# Patient Record
Sex: Female | Born: 1985 | Race: White | Hispanic: No | Marital: Married | Smoking: Current every day smoker
Health system: Southern US, Community
[De-identification: ages and names within clinical notes are randomized; demographics above are authoritative.]

## PROBLEM LIST (undated history)

## (undated) DIAGNOSIS — F419 Anxiety disorder, unspecified: Secondary | ICD-10-CM

## (undated) DIAGNOSIS — Z789 Other specified health status: Secondary | ICD-10-CM

## (undated) DIAGNOSIS — F32A Depression, unspecified: Secondary | ICD-10-CM

## (undated) DIAGNOSIS — F329 Major depressive disorder, single episode, unspecified: Secondary | ICD-10-CM

## (undated) HISTORY — PX: DILATION AND CURETTAGE OF UTERUS: SHX78

## (undated) HISTORY — PX: TONSILLECTOMY: SUR1361

---
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
## 2007-03-04 ENCOUNTER — Telehealth (INDEPENDENT_AMBULATORY_CARE_PROVIDER_SITE_OTHER): Payer: Self-pay | Admitting: *Deleted

## 2007-03-22 ENCOUNTER — Encounter: Payer: Self-pay | Admitting: Internal Medicine

## 2007-03-22 ENCOUNTER — Emergency Department: Payer: Self-pay | Admitting: Emergency Medicine

## 2007-03-22 ENCOUNTER — Other Ambulatory Visit: Payer: Self-pay

## 2007-03-22 ENCOUNTER — Telehealth: Payer: Self-pay | Admitting: Internal Medicine

## 2007-04-18 ENCOUNTER — Telehealth: Payer: Self-pay | Admitting: Family Medicine

## 2007-05-06 ENCOUNTER — Ambulatory Visit: Payer: Self-pay | Admitting: Internal Medicine

## 2007-05-06 DIAGNOSIS — H9190 Unspecified hearing loss, unspecified ear: Secondary | ICD-10-CM | POA: Insufficient documentation

## 2007-05-06 DIAGNOSIS — R079 Chest pain, unspecified: Secondary | ICD-10-CM

## 2007-05-17 ENCOUNTER — Encounter (INDEPENDENT_AMBULATORY_CARE_PROVIDER_SITE_OTHER): Payer: Self-pay | Admitting: *Deleted

## 2007-05-20 ENCOUNTER — Encounter (INDEPENDENT_AMBULATORY_CARE_PROVIDER_SITE_OTHER): Payer: Self-pay | Admitting: *Deleted

## 2007-05-26 ENCOUNTER — Encounter (INDEPENDENT_AMBULATORY_CARE_PROVIDER_SITE_OTHER): Payer: Self-pay | Admitting: *Deleted

## 2007-06-15 ENCOUNTER — Ambulatory Visit: Payer: Self-pay | Admitting: Family Medicine

## 2007-06-15 DIAGNOSIS — R1031 Right lower quadrant pain: Secondary | ICD-10-CM

## 2007-06-15 LAB — CONVERTED CEMR LAB
Beta hcg, urine, semiquantitative: NEGATIVE
Glucose, Urine, Semiquant: NEGATIVE
Protein, U semiquant: NEGATIVE
Urobilinogen, UA: 0.2
WBC Urine, dipstick: NEGATIVE
pH: 7

## 2007-06-17 ENCOUNTER — Ambulatory Visit (HOSPITAL_COMMUNITY): Admission: RE | Admit: 2007-06-17 | Discharge: 2007-06-17 | Payer: Self-pay | Admitting: Family Medicine

## 2007-06-17 LAB — CONVERTED CEMR LAB: GC Probe Amp, Genital: NEGATIVE

## 2007-08-03 ENCOUNTER — Telehealth (INDEPENDENT_AMBULATORY_CARE_PROVIDER_SITE_OTHER): Payer: Self-pay | Admitting: *Deleted

## 2007-09-08 ENCOUNTER — Ambulatory Visit: Payer: Self-pay | Admitting: Family Medicine

## 2007-09-08 LAB — CONVERTED CEMR LAB
Bilirubin Urine: NEGATIVE
Ketones, urine, test strip: NEGATIVE
Specific Gravity, Urine: <1.005
Urobilinogen, UA: 0.2

## 2007-09-09 ENCOUNTER — Telehealth: Payer: Self-pay | Admitting: Family Medicine

## 2007-09-09 ENCOUNTER — Encounter: Payer: Self-pay | Admitting: Family Medicine

## 2007-09-09 ENCOUNTER — Ambulatory Visit: Payer: Self-pay | Admitting: Cardiovascular Disease

## 2007-09-09 LAB — CONVERTED CEMR LAB
Basophils Absolute: 0 10*3/uL (ref 0.0–0.1)
Lymphocytes Relative: 33.6 % (ref 12.0–46.0)
MCHC: 34.3 g/dL (ref 30.0–36.0)
Neutrophils Relative %: 56.6 % (ref 43.0–77.0)
RDW: 12.5 % (ref 11.5–14.6)

## 2007-09-13 ENCOUNTER — Ambulatory Visit: Payer: Self-pay | Admitting: Obstetrics & Gynecology

## 2007-10-17 ENCOUNTER — Ambulatory Visit: Payer: Self-pay | Admitting: Family Medicine

## 2007-11-15 ENCOUNTER — Telehealth: Payer: Self-pay | Admitting: Internal Medicine

## 2007-11-28 ENCOUNTER — Ambulatory Visit: Payer: Self-pay | Admitting: Internal Medicine

## 2007-11-28 DIAGNOSIS — N6019 Diffuse cystic mastopathy of unspecified breast: Secondary | ICD-10-CM

## 2007-12-26 ENCOUNTER — Ambulatory Visit: Payer: Self-pay | Admitting: Internal Medicine

## 2008-01-17 ENCOUNTER — Ambulatory Visit: Payer: Self-pay | Admitting: Unknown Physician Specialty

## 2008-02-08 ENCOUNTER — Telehealth: Payer: Self-pay | Admitting: Family Medicine

## 2008-03-13 ENCOUNTER — Telehealth: Payer: Self-pay | Admitting: Internal Medicine

## 2008-04-20 ENCOUNTER — Ambulatory Visit: Payer: Self-pay | Admitting: Internal Medicine

## 2008-04-20 DIAGNOSIS — J069 Acute upper respiratory infection, unspecified: Secondary | ICD-10-CM | POA: Insufficient documentation

## 2008-06-25 ENCOUNTER — Telehealth: Payer: Self-pay | Admitting: Internal Medicine

## 2008-09-06 ENCOUNTER — Emergency Department: Payer: Self-pay | Admitting: Emergency Medicine

## 2009-01-03 ENCOUNTER — Emergency Department: Payer: Self-pay | Admitting: Emergency Medicine

## 2009-03-04 ENCOUNTER — Observation Stay: Payer: Self-pay

## 2009-08-14 ENCOUNTER — Observation Stay: Payer: Self-pay

## 2009-08-15 ENCOUNTER — Observation Stay: Payer: Self-pay

## 2009-08-17 ENCOUNTER — Inpatient Hospital Stay: Payer: Self-pay | Admitting: Obstetrics & Gynecology

## 2010-02-02 LAB — CONVERTED CEMR LAB
Chlamydia, DNA Probe: NEGATIVE
GC Probe Amp, Genital: NEGATIVE

## 2011-04-22 ENCOUNTER — Inpatient Hospital Stay (HOSPITAL_COMMUNITY)
Admission: EM | Admit: 2011-04-22 | Discharge: 2011-04-22 | Disposition: A | Discharge status: Home / Self Care | Payer: Medicaid Other | Attending: Obstetrics and Gynecology | Admitting: Obstetrics and Gynecology

## 2011-04-22 ENCOUNTER — Encounter (HOSPITAL_COMMUNITY): Payer: Self-pay | Admitting: *Deleted

## 2011-04-22 ENCOUNTER — Inpatient Hospital Stay (HOSPITAL_COMMUNITY): Payer: Medicaid Other

## 2011-04-22 DIAGNOSIS — Z3480 Encounter for supervision of other normal pregnancy, unspecified trimester: Secondary | ICD-10-CM

## 2011-04-22 DIAGNOSIS — O99891 Other specified diseases and conditions complicating pregnancy: Secondary | ICD-10-CM | POA: Insufficient documentation

## 2011-04-22 DIAGNOSIS — Z331 Pregnant state, incidental: Secondary | ICD-10-CM

## 2011-04-22 DIAGNOSIS — Y92410 Unspecified street and highway as the place of occurrence of the external cause: Secondary | ICD-10-CM | POA: Insufficient documentation

## 2011-04-22 DIAGNOSIS — R109 Unspecified abdominal pain: Secondary | ICD-10-CM | POA: Insufficient documentation

## 2011-04-22 HISTORY — DX: Other specified health status: Z78.9

## 2011-04-22 LAB — DIFFERENTIAL
Eosinophils Absolute: 0.2 10*3/uL (ref 0.0–0.7)
Eosinophils Relative: 2 % (ref 0–5)
Lymphocytes Relative: 19 % (ref 12–46)
Lymphs Abs: 2.1 10*3/uL (ref 0.7–4.0)
Monocytes Relative: 6 % (ref 3–12)

## 2011-04-22 LAB — CBC
HCT: 36.8 % (ref 36.0–46.0)
Hemoglobin: 12.5 g/dL (ref 12.0–15.0)
MCH: 31.3 pg (ref 26.0–34.0)
MCV: 92 fL (ref 78.0–100.0)
RBC: 4 MIL/uL (ref 3.87–5.11)
WBC: 11 10*3/uL — ABNORMAL HIGH (ref 4.0–10.5)

## 2011-04-22 MED ORDER — LACTATED RINGERS IV SOLN
INTRAVENOUS | Status: DC
  Administered 2011-04-22: 12:00:00 via INTRAVENOUS

## 2011-04-22 MED ORDER — LACTATED RINGERS IV SOLN: INTRAVENOUS | Status: DC
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
## 2011-04-22 NOTE — Discharge Instructions (Signed)
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
## 2011-04-22 NOTE — Progress Notes (Signed)
Updated ED MD and RN about [facility]. [facility] called and report given to [place].
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
## 2011-04-22 NOTE — Progress Notes (Signed)
Responded to page to provide Chaplain support. Social Worker involved and Orthoptist with pt.  Pt. Being transferred to [facility].

## 2011-04-22 NOTE — Progress Notes (Signed)
Dr. Estanislado Pandy called and updated on pt status with FHR reactive at 130, moderate variability, + accels, no decels, positive fetal movement palpated. Uterine irritability verses contractions every 3-5 min lasting 30-40 secs. Orders for monitoring, and transfer to MAU at [facility].

## 2011-05-20 ENCOUNTER — Observation Stay: Payer: Self-pay | Admitting: Internal Medicine

## 2011-05-25 ENCOUNTER — Inpatient Hospital Stay: Payer: Self-pay | Admitting: Obstetrics and Gynecology

## 2011-05-25 LAB — CBC WITH DIFFERENTIAL/PLATELET
Basophil #: 0.1 10*3/uL (ref 0.0–0.1)
Eosinophil %: 1 %
HGB: 11.7 g/dL — ABNORMAL LOW (ref 12.0–16.0)
Lymphocyte #: 2.4 10*3/uL (ref 1.0–3.6)
MCV: 94 fL (ref 80–100)
Monocyte #: 0.9 x10 3/mm (ref 0.2–0.9)
Monocyte %: 7 %
WBC: 13.1 10*3/uL — ABNORMAL HIGH (ref 3.6–11.0)

## 2011-07-14 ENCOUNTER — Telehealth: Payer: Self-pay | Admitting: *Deleted

## 2011-07-14 NOTE — Telephone Encounter (Signed)
Pt calling wanting to be seen for her depression/ anxiety meds, pt last seen 04/2008, pt also has [employer], I advised pt she would have to see provider on her card, per pt she will call and schedule appt with them.

## 2013-11-06 ENCOUNTER — Encounter (HOSPITAL_COMMUNITY): Payer: Self-pay | Admitting: *Deleted

## 2014-05-27 ENCOUNTER — Emergency Department: Payer: Medicaid Other

## 2014-05-27 ENCOUNTER — Emergency Department
Admission: EM | Admit: 2014-05-27 | Discharge: 2014-05-27 | Disposition: A | Discharge status: Home / Self Care | Payer: Medicaid Other | Attending: Emergency Medicine | Admitting: Emergency Medicine

## 2014-05-27 DIAGNOSIS — Z3A01 Less than 8 weeks gestation of pregnancy: Secondary | ICD-10-CM | POA: Insufficient documentation

## 2014-05-27 DIAGNOSIS — O99341 Other mental disorders complicating pregnancy, first trimester: Secondary | ICD-10-CM | POA: Insufficient documentation

## 2014-05-27 DIAGNOSIS — Z79899 Other long term (current) drug therapy: Secondary | ICD-10-CM | POA: Insufficient documentation

## 2014-05-27 DIAGNOSIS — R109 Unspecified abdominal pain: Secondary | ICD-10-CM

## 2014-05-27 DIAGNOSIS — O200 Threatened abortion: Secondary | ICD-10-CM | POA: Insufficient documentation

## 2014-05-27 DIAGNOSIS — F419 Anxiety disorder, unspecified: Secondary | ICD-10-CM | POA: Insufficient documentation

## 2014-05-27 LAB — URINALYSIS COMPLETE WITH MICROSCOPIC (ARMC ONLY)
BILIRUBIN URINE: NEGATIVE
Glucose, UA: NEGATIVE mg/dL
KETONES UR: NEGATIVE mg/dL
LEUKOCYTES UA: NEGATIVE
Nitrite: NEGATIVE
Protein, ur: NEGATIVE mg/dL
Specific Gravity, Urine: 1.003 — ABNORMAL LOW (ref 1.005–1.030)
pH: 8 (ref 5.0–8.0)

## 2014-05-27 LAB — CBC
HCT: 44.6 % (ref 35.0–47.0)
HEMOGLOBIN: 15 g/dL (ref 12.0–16.0)
MCH: 31.7 pg (ref 26.0–34.0)
MCHC: 33.5 g/dL (ref 32.0–36.0)
MCV: 94.4 fL (ref 80.0–100.0)
PLATELETS: 223 10*3/uL (ref 150–440)
RBC: 4.72 MIL/uL (ref 3.80–5.20)
RDW: 12.9 % (ref 11.5–14.5)
WBC: 9.3 10*3/uL (ref 3.6–11.0)

## 2014-05-27 LAB — ABO/RH
ABO/RH(D): A POS
ABO/RH(D): A POS

## 2014-05-27 LAB — HCG, QUANTITATIVE, PREGNANCY: hCG, Beta Chain, Quant, S: 402 m[IU]/mL — ABNORMAL HIGH (ref <5)

## 2014-05-27 MED ORDER — PROMETHAZINE HCL 25 MG PO TABS
ORAL_TABLET | ORAL | Status: AC
  Administered 2014-05-27: 12.5 mg via ORAL
  Filled 2014-05-27: qty 1

## 2014-05-27 MED ORDER — PROMETHAZINE HCL 25 MG PO TABS
12.5000 mg | ORAL_TABLET | Freq: Once | ORAL | Status: AC
  Administered 2014-05-27: 12.5 mg via ORAL

## 2014-05-27 NOTE — ED Notes (Signed)
Pt reports she is [redacted] weeks pregnant and began having vaginal bleeding one hour ago. Pt crying in triage.

## 2014-05-27 NOTE — ED Provider Notes (Signed)
[facility] Emergency Department Provider Note  ____________________________________________  Time seen: 7:25 AM  I have reviewed the triage vital signs and the nursing notes.   HISTORY  Chief Complaint Vaginal Bleeding      HPI Sarah Rivas is a 29 y.o. female who presents with vaginal bleeding that began at approximately 3 AM. She reports she is approximately [redacted] weeks pregnant. She denies pelvic cramping. No fevers chills. No nausea vomiting. Severity is mild.     Past Medical History  Diagnosis Date  . No pertinent past medical history     Patient Active Problem List   Diagnosis Date Noted  . URI 04/20/2008  . FIBROCYSTIC BREAST DISEASE 11/28/2007  . ABDOMINAL PAIN, RIGHT LOWER QUADRANT 06/15/2007  . HEARING LOSS 05/06/2007  . CHEST PAIN 05/06/2007  . COMMON MIGRAINE 09/01/2006  . DISORDER, MENSTRUAL NOS 09/01/2006  . ANXIETY 04/19/2006  . ANXIETY, SITUATIONAL 04/19/2006  . DEPRESSION 04/19/2006  . SYNCOPE 04/19/2006    Past Surgical History  Procedure Laterality Date  . Tonsillectomy      Current Outpatient Rx  Name  Route  Sig  Dispense  Refill  . prenatal vitamin w/FE, FA (PRENATAL 1 + 1) 27-1 MG TABS   Oral   Take 1 tablet by mouth daily.           Allergies Codeine and Sertraline hcl  Family History  Problem Relation Age of Onset  . Anesthesia problems Neg Hx     Social History History  Substance Use Topics  . Smoking status: Never Smoker   . Smokeless tobacco: Not on file  . Alcohol Use: No    Review of Systems  Constitutional: Negative for fever. Eyes: Negative for visual changes. ENT: Negative for sore throat. Cardiovascular: Negative for chest pain. Respiratory: Negative for shortness of breath. Gastrointestinal: Negative for abdominal pain, vomiting and diarrhea. Genitourinary: Negative for dysuria. Positive for vaginal bleeding Musculoskeletal: Negative for back pain. Skin: Negative for  rash. Neurological: Negative for headaches, focal weakness or numbness. Psychiatric: Anxious, tearful  10-point ROS otherwise negative.  ____________________________________________   PHYSICAL EXAM:  VITAL SIGNS: ED Triage Vitals  Enc Vitals Group     BP 05/27/14 0433 97/71 mmHg     Pulse Rate 05/27/14 0433 85     Resp 05/27/14 0433 28     Temp 05/27/14 0433 97.5 F (36.4 C)     Temp Source 05/27/14 0433 Oral     SpO2 05/27/14 0433 100 %     Weight 05/27/14 0430 120 lb (54.432 kg)     Height 05/27/14 0430 5\' 3"  (1.6 m)     Head Cir --      Peak Flow --      Pain Score 05/27/14 0430 6     Pain Loc --      Pain Edu? --      Excl. in [place]? --      Constitutional: Alert and oriented. Well appearing and in no distress. Eyes: Conjunctivae are normal. PERRL. Normal extraocular movements.  Cardiovascular: Normal rate, regular rhythm. Normal and symmetric distal pulses are present in all extremities. No murmurs, rubs, or gallops. Respiratory: Normal respiratory effort without tachypnea nor retractions. Breath sounds are clear and equal bilaterally. No wheezes/rales/rhonchi. Gastrointestinal: Soft and nontender. No distention. There is no CVA tenderness. Genitourinary: deferred Musculoskeletal: Nontender with normal range of motion in all extremities. No joint effusions.  No lower extremity tenderness nor edema. Neurologic:  Normal speech and language. No gross  focal neurologic deficits are appreciated. Speech is normal.  Skin:  Skin is warm, dry and intact. No rash noted. Psychiatric: Mood and affect are normal. Speech and behavior are normal. Patient exhibits appropriate insight and judgment.  ____________________________________________    LABS (pertinent positives/negatives)  Labs Reviewed  HCG, QUANTITATIVE, PREGNANCY - Abnormal; Notable for the following:    hCG, Beta Chain, Quant, S 402 (*)    All other components within normal limits  URINALYSIS COMPLETEWITH  MICROSCOPIC ([facility])  - Abnormal; Notable for the following:    Color, Urine STRAW (*)    APPearance CLEAR (*)    Specific Gravity, Urine 1.003 (*)    Hgb urine dipstick 3+ (*)    Bacteria, UA RARE (*)    Squamous Epithelial / LPF 0-5 (*)    All other components within normal limits  CBC  ABO/RH  ABO/RH     ____________________________________________   EKG  None  ____________________________________________    RADIOLOGY  Ultrasound shows no intrauterine pregnancy  ____________________________________________   PROCEDURES  Procedure(s) performed: None  Critical Care performed: None ____________________________________________   INITIAL IMPRESSION / ASSESSMENT AND PLAN / ED COURSE  Pertinent labs & imaging results that were available during my care of the patient were reviewed by me and considered in my medical decision making (see chart for details).  Patient with likely very early pregnancy versus miscarriage versus possible ectopic. Given beta and no IUP on ultrasound patient will need repeat labs in 2 days. She understands this and will follow up with her GYN. Hemoglobin 15  ____________________________________________   FINAL CLINICAL IMPRESSION(S) / ED DIAGNOSES  Threatened miscarriage   Lavonia Drafts, MD 05/27/14 [id number]

## 2014-05-27 NOTE — Discharge Instructions (Signed)
Your hormone level is 403 today. This needs to be rechecked in 48 hours.     Threatened Miscarriage A threatened miscarriage is when you have vaginal bleeding during your first 20 weeks of pregnancy but the pregnancy has not ended. Your doctor will do tests to make sure you are still pregnant. The cause of the bleeding may not be known. This condition does not mean your pregnancy will end. It does increase the risk of it ending (complete miscarriage). HOME CARE   Make sure you keep all your doctor visits for prenatal care.  Get plenty of rest.  Do not have sex or use tampons if you have vaginal bleeding.  Do not douche.  Do not smoke or use drugs.  Do not drink alcohol.  Avoid caffeine. GET HELP IF:  You have light bleeding from your vagina.  You have belly pain or cramping.  You have a fever. GET HELP RIGHT AWAY IF:   You have heavy bleeding from your vagina.  You have clots of blood coming from your vagina.  You have bad pain or cramps in your low back or belly.  You have fever, chills, and bad belly pain. MAKE SURE YOU:   Understand these instructions.  Will watch your condition.  Will get help right away if you are not doing well or get worse. Document Released: 12/05/2007 Document Revised: 12/27/2012 Document Reviewed: 10/18/2012 [facility] Patient Information 2015 [place], [place]. This information is not intended to replace advice given to you by your health care provider. Make sure you discuss any questions you have with your health care provider.

## 2014-05-29 ENCOUNTER — Emergency Department
Admission: EM | Admit: 2014-05-29 | Discharge: 2014-05-29 | Disposition: A | Discharge status: Home / Self Care | Payer: Medicaid Other | Attending: Emergency Medicine | Admitting: Emergency Medicine

## 2014-05-29 ENCOUNTER — Encounter: Payer: Self-pay | Admitting: Emergency Medicine

## 2014-05-29 DIAGNOSIS — Z3A01 Less than 8 weeks gestation of pregnancy: Secondary | ICD-10-CM | POA: Insufficient documentation

## 2014-05-29 DIAGNOSIS — O039 Complete or unspecified spontaneous abortion without complication: Secondary | ICD-10-CM | POA: Insufficient documentation

## 2014-05-29 DIAGNOSIS — Z79899 Other long term (current) drug therapy: Secondary | ICD-10-CM | POA: Insufficient documentation

## 2014-05-29 LAB — CBC
HCT: 42.6 % (ref 35.0–47.0)
Hemoglobin: 14.2 g/dL (ref 12.0–16.0)
MCH: 31.9 pg (ref 26.0–34.0)
MCHC: 33.4 g/dL (ref 32.0–36.0)
MCV: 95.6 fL (ref 80.0–100.0)
Platelets: 210 10*3/uL (ref 150–440)
RBC: 4.46 MIL/uL (ref 3.80–5.20)
RDW: 12.9 % (ref 11.5–14.5)
WBC: 5.6 10*3/uL (ref 3.6–11.0)

## 2014-05-29 LAB — HCG, QUANTITATIVE, PREGNANCY: hCG, Beta Chain, Quant, S: 92 m[IU]/mL — ABNORMAL HIGH (ref <5)

## 2014-05-29 NOTE — ED Notes (Signed)
States she started bleeding on Sunday am ..pt is 4 weeks preg.  Bleeding is slower now but was heavy yesterday

## 2014-05-29 NOTE — Discharge Instructions (Signed)
Miscarriage A miscarriage is the sudden loss of an unborn baby (fetus) before the 20th week of pregnancy. Most miscarriages happen in the first 3 months of pregnancy. Sometimes, it happens before a woman even knows she is pregnant. A miscarriage is also called a "spontaneous miscarriage" or "early pregnancy loss." Having a miscarriage can be an emotional experience. Talk with your caregiver about any questions you may have about miscarrying, the grieving process, and your future pregnancy plans. CAUSES   Problems with the fetal chromosomes that make it impossible for the baby to develop normally. Problems with the baby's genes or chromosomes are most often the result of errors that occur, by chance, as the embryo divides and grows. The problems are not inherited from the parents.  Infection of the cervix or uterus.   Hormone problems.   Problems with the cervix, such as having an incompetent cervix. This is when the tissue in the cervix is not strong enough to hold the pregnancy.   Problems with the uterus, such as an abnormally shaped uterus, uterine fibroids, or congenital abnormalities.   Certain medical conditions.   Smoking, drinking alcohol, or taking illegal drugs.   Trauma.  Often, the cause of a miscarriage is unknown.  SYMPTOMS   Vaginal bleeding or spotting, with or without cramps or pain.  Pain or cramping in the abdomen or lower back.  Passing fluid, tissue, or blood clots from the vagina. DIAGNOSIS  Your caregiver will perform a physical exam. You may also have an ultrasound to confirm the miscarriage. Blood or urine tests may also be ordered. TREATMENT   Sometimes, treatment is not necessary if you naturally pass all the fetal tissue that was in the uterus. If some of the fetus or placenta remains in the body (incomplete miscarriage), tissue left behind may become infected and must be removed. Usually, a dilation and curettage (D and C) procedure is performed.  During a D and C procedure, the cervix is widened (dilated) and any remaining fetal or placental tissue is gently removed from the uterus.  Antibiotic medicines are prescribed if there is an infection. Other medicines may be given to reduce the size of the uterus (contract) if there is a lot of bleeding.  If you have Rh negative blood and your baby was Rh positive, you will need a Rh immunoglobulin shot. This shot will protect any future baby from having Rh blood problems in future pregnancies. HOME CARE INSTRUCTIONS   Your caregiver may order bed rest or may allow you to continue light activity. Resume activity as directed by your caregiver.  Have someone help with home and family responsibilities during this time.   Keep track of the number of sanitary pads you use each day and how soaked (saturated) they are. Write down this information.   Do not use tampons. Do not douche or have sexual intercourse until approved by your caregiver.   Only take over-the-counter or prescription medicines for pain or discomfort as directed by your caregiver.   Do not take aspirin. Aspirin can cause bleeding.   Keep all follow-up appointments with your caregiver.   If you or your partner have problems with grieving, talk to your caregiver or seek counseling to help cope with the pregnancy loss. Allow enough time to grieve before trying to get pregnant again.  SEEK IMMEDIATE MEDICAL CARE IF:   You have severe cramps or pain in your back or abdomen.  You have a fever.  You pass large blood clots (walnut-sized   or larger) ortissue from your vagina. Save any tissue for your caregiver to inspect.   Your bleeding increases.   You have a thick, bad-smelling vaginal discharge.  You become lightheaded, weak, or you faint.   You have chills.  MAKE SURE YOU:  Understand these instructions.  Will watch your condition.  Will get help right away if you are not doing well or get  worse. Document Released: 06/17/2000 Document Revised: 04/18/2012 Document Reviewed: 02/10/2011 [employer] Patient Information 2015 [employer]. This information is not intended to replace advice given to you by your health care provider. Make sure you discuss any questions you have with your health care provider.  

## 2014-05-29 NOTE — ED Notes (Signed)
Pt states she was seen in the ED on Sunday for possible miscarriage, states she was supposed to have her beta hcg rechecked, states she called ob MD and was advised that she needed $600 to come and have blood drawn so she returned here to have repeat test, pt states she now only has some mild lower abd. Cramping and the bleeding has improved

## 2014-05-29 NOTE — ED Provider Notes (Signed)
[facility] Emergency Department Provider Note  ____________________________________________  Time seen: 1:35 PM  I have reviewed the triage vital signs and the nursing notes.   HISTORY  Chief Complaint Vaginal Bleeding    HPI Sarah Rivas is a 29 y.o. female who reports she is about [redacted] weeks pregnant and comes in with vaginal bleeding for 3 days. The bleeding is similar to her menstrual cycle does not have a she's not soaking through multiple pads per hour. No chest pain shortness of breath fever chills nausea vomiting lightheadedness dizziness or passing out. In the ED 2 days ago diagnosed with a threatened miscarriage and told to follow-up for repeat blood test with OB. However she reports she is unable to afford DOB test so she re-presents to the ED for a repeat blood test. No new symptoms. Denies abdominal pain.     Past Medical History  Diagnosis Date  . No pertinent past medical history    migraines Anxiety  Patient Active Problem List   Diagnosis Date Noted  . URI 04/20/2008  . FIBROCYSTIC BREAST DISEASE 11/28/2007  . ABDOMINAL PAIN, RIGHT LOWER QUADRANT 06/15/2007  . HEARING LOSS 05/06/2007  . CHEST PAIN 05/06/2007  . COMMON MIGRAINE 09/01/2006  . DISORDER, MENSTRUAL NOS 09/01/2006  . ANXIETY 04/19/2006  . ANXIETY, SITUATIONAL 04/19/2006  . DEPRESSION 04/19/2006  . SYNCOPE 04/19/2006    Past Surgical History  Procedure Laterality Date  . Tonsillectomy      Current Outpatient Rx  Name  Route  Sig  Dispense  Refill  . prenatal vitamin w/FE, FA (PRENATAL 1 + 1) 27-1 MG TABS   Oral   Take 1 tablet by mouth daily.           Allergies Codeine and Sertraline hcl  Family History  Problem Relation Age of Onset  . Anesthesia problems Neg Hx     Social History History  Substance Use Topics  . Smoking status: Never Smoker   . Smokeless tobacco: Not on file  . Alcohol Use: No    Review of Systems  Constitutional: No  fever or chills. No weight changes Eyes:No blurry vision or double vision.  ENT: No sore throat. Cardiovascular: No chest pain. Respiratory: No dyspnea or cough. Gastrointestinal: Negative for abdominal pain, vomiting and diarrhea.  No BRBPR or melena. Genitourinary: Negative for dysuria, urinary retention, bloody urine, or difficulty urinating. Positive vaginal bleeding, pregnant Musculoskeletal: Negative for back pain. No joint swelling or pain. Skin: Negative for rash. Neurological: Negative for headaches, focal weakness or numbness. Psychiatric:No anxiety or depression.   Endocrine:No hot/cold intolerance, changes in energy, or sleep difficulty.  10-point ROS otherwise negative.  ____________________________________________   PHYSICAL EXAM:  VITAL SIGNS: ED Triage Vitals  Enc Vitals Group     BP 05/29/14 1105 116/54 mmHg     Pulse Rate 05/29/14 1105 94     Resp 05/29/14 1105 18     Temp 05/29/14 1105 98.5 F (36.9 C)     Temp Source 05/29/14 1105 Oral     SpO2 --      Weight 05/29/14 1105 117 lb (53.071 kg)     Height 05/29/14 1105 5\' 3"  (1.6 m)     Head Cir --      Peak Flow --      Pain Score 05/29/14 1119 4     Pain Loc --      Pain Edu? --      Excl. in [place]? --  Constitutional: Alert and oriented. Well appearing and in no distress. Patient appears upset and is tearful Eyes: No scleral icterus. No conjunctival pallor. PERRL. EOMI ENT   Head: Normocephalic and atraumatic.   Nose: No congestion/rhinnorhea. No septal hematoma   Mouth/Throat: MMM, no pharyngeal erythema. No peritonsillar mass. No uvula shift.   Neck: No stridor. No SubQ emphysema. No meningismus. Hematological/Lymphatic/Immunilogical: No cervical lymphadenopathy. Cardiovascular: RRR. Normal and symmetric distal pulses are present in all extremities. No murmurs, rubs, or gallops. Respiratory: Normal respiratory effort without tachypnea nor retractions. Breath sounds are clear and  equal bilaterally. No wheezes/rales/rhonchi. Gastrointestinal: Soft and nontender. No distention. There is no CVA tenderness.  No rebound, rigidity, or guarding. Genitourinary: deferred Musculoskeletal: Nontender with normal range of motion in all extremities. No joint effusions.  No lower extremity tenderness.  No edema. Neurologic:   Normal speech and language.  CN 2-10 normal. Motor grossly intact. No pronator drift.  Normal gait. No gross focal neurologic deficits are appreciated.  Skin:  Skin is warm, dry and intact. No rash noted.  No petechiae, purpura, or bullae. Psychiatric: Mood and affect are normal. Speech and behavior are normal. Patient exhibits appropriate insight and judgment.  ____________________________________________    LABS (pertinent positives/negatives) (all labs ordered are listed, but only abnormal results are displayed) Labs Reviewed  HCG, QUANTITATIVE, PREGNANCY - Abnormal; Notable for the following:    hCG, Beta Chain, Quant, S 92 (*)    All other components within normal limits  CBC  ABO/RH   Rh+ ____________________________________________   EKG    ____________________________________________    RADIOLOGY  Pelvic ultrasound on May 22 reveals empty uterus with tendon endometrial fluid collection. Unable to identify a definitive pregnancy location.  ____________________________________________   PROCEDURES  ____________________________________________   INITIAL IMPRESSION / ASSESSMENT AND PLAN / ED COURSE  Pertinent labs & imaging results that were available during my care of the patient were reviewed by me and considered in my medical decision making (see chart for details).  Patient no acute distress with reassuring examination today. Low suspicion of ectopic pregnancy or endometritis or other infection. Beta hCG has decreased from 490 over 2 days consistent with terminated pregnancy and spontaneous abortion. At this time it appears  that the miscarriage is complete. She is calm and comfortable and I counseled her on what to expect and when to follow-up.  ____________________________________________   FINAL CLINICAL IMPRESSION(S) / ED DIAGNOSES  Final diagnoses:  Spontaneous abortion in first trimester      Carrie Mew, MD 05/29/14 1349

## 2014-12-12 ENCOUNTER — Emergency Department
Admission: EM | Admit: 2014-12-12 | Discharge: 2014-12-12 | Disposition: A | Discharge status: Home / Self Care | Payer: Medicaid Other | Attending: Emergency Medicine | Admitting: Emergency Medicine

## 2014-12-12 DIAGNOSIS — J029 Acute pharyngitis, unspecified: Secondary | ICD-10-CM | POA: Diagnosis not present

## 2014-12-12 DIAGNOSIS — D210 Benign neoplasm of connective and other soft tissue of head, face and neck: Secondary | ICD-10-CM | POA: Insufficient documentation

## 2014-12-12 DIAGNOSIS — D2321 Other benign neoplasm of skin of right ear and external auricular canal: Secondary | ICD-10-CM

## 2014-12-12 LAB — POCT RAPID STREP A: STREPTOCOCCUS, GROUP A SCREEN (DIRECT): NEGATIVE

## 2014-12-12 MED ORDER — IBUPROFEN 800 MG PO TABS: 800.0000 mg | ORAL_TABLET | Freq: Three times a day (TID) | ORAL | Status: DC | PRN

## 2014-12-12 MED ORDER — AZITHROMYCIN 250 MG PO TABS: ORAL_TABLET | ORAL | Status: DC

## 2014-12-12 MED ORDER — LIDOCAINE VISCOUS 2 % MT SOLN: 20.0000 mL | OROMUCOSAL | Status: DC | PRN

## 2014-12-12 NOTE — ED Provider Notes (Signed)
[facility] Emergency Department Provider Note  ____________________________________________  Time seen: Approximately 8:47 PM  I have reviewed the triage vital signs and the nursing notes.   HISTORY  Chief Complaint Sore Throat    HPI Sarah Rivas is a 29 y.o. female who presents for evaluation of sore throat with lumps on the right side of her jaw and neck. Patient states she states that there is a bump on the top of her right ear this been getting worse. Complains of having headache. Denies any fever chills or cough.   Past Medical History  Diagnosis Date  . No pertinent past medical history     Patient Active Problem List   Diagnosis Date Noted  . URI 04/20/2008  . FIBROCYSTIC BREAST DISEASE 11/28/2007  . ABDOMINAL PAIN, RIGHT LOWER QUADRANT 06/15/2007  . HEARING LOSS 05/06/2007  . CHEST PAIN 05/06/2007  . COMMON MIGRAINE 09/01/2006  . DISORDER, MENSTRUAL NOS 09/01/2006  . ANXIETY 04/19/2006  . ANXIETY, SITUATIONAL 04/19/2006  . DEPRESSION 04/19/2006  . SYNCOPE 04/19/2006    Past Surgical History  Procedure Laterality Date  . Tonsillectomy      Current Outpatient Rx  Name  Route  Sig  Dispense  Refill  . azithromycin (ZITHROMAX Z-PAK) 250 MG tablet      Take 2 tablets (500 mg) on  Day 1,  followed by 1 tablet (250 mg) once daily on Days 2 through 5.   6 each   0   . ibuprofen (ADVIL,MOTRIN) 800 MG tablet   Oral   Take 1 tablet (800 mg total) by mouth every 8 (eight) hours as needed.   30 tablet   0   . lidocaine (XYLOCAINE) 2 % solution   Mouth/Throat   Use as directed 20 mLs in the mouth or throat as needed for mouth pain.   100 mL   0     Allergies Codeine and Sertraline hcl  Family History  Problem Relation Age of Onset  . Anesthesia problems Neg Hx     Social History Social History  Substance Use Topics  . Smoking status: Never Smoker   . Smokeless tobacco: None  . Alcohol Use: No    Review of  Systems Constitutional: No fever/chills Eyes: No visual changes. ENT: Positive for sore throat Cardiovascular: Denies chest pain. Respiratory: Denies shortness of breath. Gastrointestinal: No abdominal pain.  No nausea, no vomiting.  No diarrhea.  No constipation. Genitourinary: Negative for dysuria. Musculoskeletal: Negative for back pain. Skin: Negative for rash. Neurological: Negative for headaches, focal weakness or numbness.  10-point ROS otherwise negative.  ____________________________________________   PHYSICAL EXAM:  VITAL SIGNS: ED Triage Vitals  Enc Vitals Group     BP 12/12/14 2016 100/63 mmHg     Pulse Rate 12/12/14 2016 85     Resp 12/12/14 2016 14     Temp 12/12/14 2016 98.7 F (37.1 C)     Temp Source 12/12/14 2016 Oral     SpO2 12/12/14 2016 100 %     Weight 12/12/14 2016 110 lb (49.896 kg)     Height 12/12/14 2016 5\' 3"  (1.6 m)     Head Cir --      Peak Flow --      Pain Score 12/12/14 2016 7     Pain Loc --      Pain Edu? --      Excl. in [place]? --     Constitutional: Alert and oriented. Well appearing and in no  acute distress. Eyes: Conjunctivae are normal. PERRL. EOMI. Head: Atraumatic. Nose: No congestion/rhinnorhea. Mouth/Throat: Mucous membranes are moist.  Oropharynx slightly erythematous with no exudate noted. Neck: No stridor.  Cervical adenopathy. Cardiovascular: Normal rate, regular rhythm. Grossly normal heart sounds.  Good peripheral circulation. Respiratory: Normal respiratory effort.  No retractions. Lungs CTAB. Musculoskeletal: No lower extremity tenderness nor edema.  No joint effusions. Neurologic:  Normal speech and language. No gross focal neurologic deficits are appreciated. No gait instability. Skin:  Skin is warm, dry and intact. No rash noted. Psychiatric: Mood and affect are normal. Speech and behavior are normal.  ____________________________________________   LABS (all labs ordered are listed, but only abnormal results  are displayed)  Labs Reviewed  POCT RAPID STREP A      PROCEDURES  Procedure(s) performed: None  Critical Care performed: No  ____________________________________________   INITIAL IMPRESSION / ASSESSMENT AND PLAN / ED COURSE  Pertinent labs & imaging results that were available during my care of the patient were reviewed by me and considered in my medical decision making (see chart for details).  Acute pharyngitis with dermoid cyst on her right ear. Rx given for Zithromax, viscous lidocaine, Motrin 800 mg. Patient follow-up with PCP or return when necessary any symptomology. Patient voices no other emergency medical complaints at this time. ____________________________________________   FINAL CLINICAL IMPRESSION(S) / ED DIAGNOSES  Final diagnoses:  Acute pharyngitis, unspecified pharyngitis type  Dermoid cyst of right ear      Arlyss Repress, PA-C 12/12/14 [id number]  Delman Kitten, MD 12/12/14 [id number]

## 2014-12-12 NOTE — Discharge Instructions (Signed)
Pharyngitis °Pharyngitis is redness, pain, and swelling (inflammation) of your pharynx.  °CAUSES  °Pharyngitis is usually caused by infection. Most of the time, these infections are from viruses (viral) and are part of a cold. However, sometimes pharyngitis is caused by bacteria (bacterial). Pharyngitis can also be caused by allergies. Viral pharyngitis may be spread from person to person by coughing, sneezing, and personal items or utensils (cups, forks, spoons, toothbrushes). Bacterial pharyngitis may be spread from person to person by more intimate contact, such as kissing.  °SIGNS AND SYMPTOMS  °Symptoms of pharyngitis include:   °· Sore throat.   °· Tiredness (fatigue).   °· Low-grade fever.   °· Headache. °· Joint pain and muscle aches. °· Skin rashes. °· Swollen lymph nodes. °· Plaque-like film on throat or tonsils (often seen with bacterial pharyngitis). °DIAGNOSIS  °Your health care provider will ask you questions about your illness and your symptoms. Your medical history, along with a physical exam, is often all that is needed to diagnose pharyngitis. Sometimes, a rapid strep test is done. Other lab tests may also be done, depending on the suspected cause.  °TREATMENT  °Viral pharyngitis will usually get better in 3-4 days without the use of medicine. Bacterial pharyngitis is treated with medicines that kill germs (antibiotics).  °HOME CARE INSTRUCTIONS  °· Drink enough water and fluids to keep your urine clear or pale yellow.   °· Only take over-the-counter or prescription medicines as directed by your health care provider:   °· If you are prescribed antibiotics, make sure you finish them even if you start to feel better.   °· Do not take aspirin.   °· Get lots of rest.   °· Gargle with 8 oz of salt water (½ tsp of salt per 1 qt of water) as often as every 1-2 hours to soothe your throat.   °· Throat lozenges (if you are not at risk for choking) or sprays may be used to soothe your throat. °SEEK MEDICAL  CARE IF:  °· You have large, tender lumps in your neck. °· You have a rash. °· You cough up green, yellow-brown, or bloody spit. °SEEK IMMEDIATE MEDICAL CARE IF:  °· Your neck becomes stiff. °· You drool or are unable to swallow liquids. °· You vomit or are unable to keep medicines or liquids down. °· You have severe pain that does not go away with the use of recommended medicines. °· You have trouble breathing (not caused by a stuffy nose). °MAKE SURE YOU:  °· Understand these instructions. °· Will watch your condition. °· Will get help right away if you are not doing well or get worse. °  °This information is not intended to replace advice given to you by your health care provider. Make sure you discuss any questions you have with your health care provider. °  °Document Released: 12/22/2004 Document Revised: 10/12/2012 Document Reviewed: 08/29/2012 °Elsevier Interactive Patient Education ©2016 [employer] ° °Rapid Strep Test °Strep throat is a bacterial infection caused by the bacteria Streptococcus pyogenes. A rapid strep test is the quickest way to check if these bacteria are causing your sore throat. The test can be done at your health care provider's office. Results are usually ready in 10-20 minutes. °You may have this test if you have symptoms of strep throat. These include:  °· A red throat with yellow or white spots. °· Neck swelling and tenderness. °· Fever. °· Loss of appetite. °· Trouble breathing or swallowing. °· Rash. °· Dehydration. °This test requires a sample of fluid from the   back of your throat and tonsils. Your health care provider may hold down your tongue with a tongue depressor and use a swab to collect the sample.  °Your health care provider may collect a second sample at the same time. The second sample may be used for a throat culture. In a culture test, the sample is combined with a substance that encourages bacteria to grow. It takes longer to get the results of the throat culture  test, but they are more accurate. They can confirm the results from a rapid strep test, or show that those results were wrong. °RESULTS  °It is your responsibility to obtain your test results. Ask the lab or department performing the test when and how you will get your results. Contact your health care provider to discuss any questions you have about your results.  °The results of the rapid strep test will be negative or positive.  °Meaning of Negative Test Results °If the result of your rapid strep test is negative, then it means:  °· It is likely that you do not have strep throat. °· A virus may be causing your sore throat. °Your health care provider may do a throat culture to confirm the results of the rapid strep test. The throat culture can also identify the different strains of strep bacteria. °Meaning of Positive Test Results °If the result of your rapid strep test is positive, then it means: °· It is likely that you do have strep throat. °· You may have to take antibiotics. °Your health care provider may do a throat culture to confirm the results of the rapid strep test. Strep throat usually requires a course of antibiotics.  °  °This information is not intended to replace advice given to you by your health care provider. Make sure you discuss any questions you have with your health care provider. °  °Document Released: 01/30/2004 Document Revised: 01/12/2014 Document Reviewed: 03/30/2013 °Elsevier Interactive Patient Education ©2016 [employer] ° °

## 2014-12-12 NOTE — ED Notes (Signed)
Pt presents to ED with c/o sore throat and "lumps" to the right side of the jaw and right side of the neck. Pt denies any N/V/D, but reports a severe HA. Respirations are regular, even and unlabored.

## 2015-01-02 ENCOUNTER — Emergency Department
Admission: EM | Admit: 2015-01-02 | Discharge: 2015-01-02 | Disposition: A | Discharge status: Home / Self Care | Payer: Medicaid Other

## 2015-01-02 ENCOUNTER — Emergency Department: Payer: Medicaid Other

## 2015-01-02 ENCOUNTER — Encounter: Payer: Self-pay | Admitting: *Deleted

## 2015-01-02 ENCOUNTER — Emergency Department
Admission: EM | Admit: 2015-01-02 | Discharge: 2015-01-02 | Disposition: A | Discharge status: Home / Self Care | Payer: Medicaid Other | Attending: Emergency Medicine | Admitting: Emergency Medicine

## 2015-01-02 DIAGNOSIS — O9989 Other specified diseases and conditions complicating pregnancy, childbirth and the puerperium: Secondary | ICD-10-CM | POA: Insufficient documentation

## 2015-01-02 DIAGNOSIS — Z3A01 Less than 8 weeks gestation of pregnancy: Secondary | ICD-10-CM | POA: Diagnosis not present

## 2015-01-02 DIAGNOSIS — Z3490 Encounter for supervision of normal pregnancy, unspecified, unspecified trimester: Secondary | ICD-10-CM

## 2015-01-02 DIAGNOSIS — R1030 Lower abdominal pain, unspecified: Secondary | ICD-10-CM | POA: Insufficient documentation

## 2015-01-02 LAB — URINALYSIS COMPLETE WITH MICROSCOPIC (ARMC ONLY)
BACTERIA UA: NONE SEEN
Bilirubin Urine: NEGATIVE
Glucose, UA: NEGATIVE mg/dL
HGB URINE DIPSTICK: NEGATIVE
Ketones, ur: NEGATIVE mg/dL
LEUKOCYTES UA: NEGATIVE
Nitrite: NEGATIVE
PH: 7 (ref 5.0–8.0)
PROTEIN: NEGATIVE mg/dL
RBC / HPF: NONE SEEN RBC/hpf (ref 0–5)
SPECIFIC GRAVITY, URINE: 1.014 (ref 1.005–1.030)
WBC, UA: NONE SEEN WBC/hpf (ref 0–5)

## 2015-01-02 LAB — HCG, QUANTITATIVE, PREGNANCY: HCG, BETA CHAIN, QUANT, S: 3798 m[IU]/mL — AB (ref <5)

## 2015-01-02 NOTE — Discharge Instructions (Signed)
First Trimester of Pregnancy The first trimester of pregnancy is from week 1 until the end of week 12 (months 1 through 3). A week after a sperm fertilizes an egg, the egg will implant on the wall of the uterus. This embryo will begin to develop into a baby. Genes from you and your partner are forming the baby. The female genes determine whether the baby is a boy or a girl. At 6-8 weeks, the eyes and face are formed, and the heartbeat can be seen on ultrasound. At the end of 12 weeks, all the baby's organs are formed.  Now that you are pregnant, you will want to do everything you can to have a healthy baby. Two of the most important things are to get good prenatal care and to follow your health care provider's instructions. Prenatal care is all the medical care you receive before the baby's birth. This care will help prevent, find, and treat any problems during the pregnancy and childbirth. BODY CHANGES Your body goes through many changes during pregnancy. The changes vary from woman to woman.   You may gain or lose a couple of pounds at first.  You may feel sick to your stomach (nauseous) and throw up (vomit). If the vomiting is uncontrollable, call your health care provider.  You may tire easily.  You may develop headaches that can be relieved by medicines approved by your health care provider.  You may urinate more often. Painful urination may mean you have a bladder infection.  You may develop heartburn as a result of your pregnancy.  You may develop constipation because certain hormones are causing the muscles that push waste through your intestines to slow down.  You may develop hemorrhoids or swollen, bulging veins (varicose veins).  Your breasts may begin to grow larger and become tender. Your nipples may stick out more, and the tissue that surrounds them (areola) may become darker.  Your gums may bleed and may be sensitive to brushing and flossing.  Dark spots or blotches (chloasma,  mask of pregnancy) may develop on your face. This will likely fade after the baby is born.  Your menstrual periods will stop.  You may have a loss of appetite.  You may develop cravings for certain kinds of food.  You may have changes in your emotions from day to day, such as being excited to be pregnant or being concerned that something may go wrong with the pregnancy and baby.  You may have more vivid and strange dreams.  You may have changes in your hair. These can include thickening of your hair, rapid growth, and changes in texture. Some women also have hair loss during or after pregnancy, or hair that feels dry or thin. Your hair will most likely return to normal after your baby is born. WHAT TO EXPECT AT YOUR PRENATAL VISITS During a routine prenatal visit:  You will be weighed to make sure you and the baby are growing normally.  Your blood pressure will be taken.  Your abdomen will be measured to track your baby's growth.  The fetal heartbeat will be listened to starting around week 10 or 12 of your pregnancy.  Test results from any previous visits will be discussed. Your health care provider may ask you:  How you are feeling.  If you are feeling the baby move.  If you have had any abnormal symptoms, such as leaking fluid, bleeding, severe headaches, or abdominal cramping.  If you are using any tobacco products,   including cigarettes, chewing tobacco, and electronic cigarettes.  If you have any questions. Other tests that may be performed during your first trimester include:  Blood tests to find your blood type and to check for the presence of any previous infections. They will also be used to check for low iron levels (anemia) and Rh antibodies. Later in the pregnancy, blood tests for diabetes will be done along with other tests if problems develop.  Urine tests to check for infections, diabetes, or protein in the urine.  An ultrasound to confirm the proper growth  and development of the baby.  An amniocentesis to check for possible genetic problems.  Fetal screens for spina bifida and Down syndrome.  You may need other tests to make sure you and the baby are doing well.  HIV (human immunodeficiency virus) testing. Routine prenatal testing includes screening for HIV, unless you choose not to have this test. HOME CARE INSTRUCTIONS  Medicines  Follow your health care provider's instructions regarding medicine use. Specific medicines may be either safe or unsafe to take during pregnancy.  Take your prenatal vitamins as directed.  If you develop constipation, try taking a stool softener if your health care provider approves. Diet  Eat regular, well-balanced meals. Choose a variety of foods, such as meat or vegetable-based protein, fish, milk and low-fat dairy products, vegetables, fruits, and whole grain breads and cereals. Your health care provider will help you determine the amount of weight gain that is right for you.  Avoid raw meat and uncooked cheese. These carry germs that can cause birth defects in the baby.  Eating four or five small meals rather than three large meals a day may help relieve nausea and vomiting. If you start to feel nauseous, eating a few soda crackers can be helpful. Drinking liquids between meals instead of during meals also seems to help nausea and vomiting.  If you develop constipation, eat more high-fiber foods, such as fresh vegetables or fruit and whole grains. Drink enough fluids to keep your urine clear or pale yellow. Activity and Exercise  Exercise only as directed by your health care provider. Exercising will help you:  Control your weight.  Stay in shape.  Be prepared for labor and delivery.  Experiencing pain or cramping in the lower abdomen or low back is a good sign that you should stop exercising. Check with your health care provider before continuing normal exercises.  Try to avoid standing for long  periods of time. Move your legs often if you must stand in one place for a long time.  Avoid heavy lifting.  Wear low-heeled shoes, and practice good posture.  You may continue to have sex unless your health care provider directs you otherwise. Relief of Pain or Discomfort  Wear a good support bra for breast tenderness.   Take warm sitz baths to soothe any pain or discomfort caused by hemorrhoids. Use hemorrhoid cream if your health care provider approves.   Rest with your legs elevated if you have leg cramps or low back pain.  If you develop varicose veins in your legs, wear support hose. Elevate your feet for 15 minutes, 3-4 times a day. Limit salt in your diet. Prenatal Care  Schedule your prenatal visits by the twelfth week of pregnancy. They are usually scheduled monthly at first, then more often in the last 2 months before delivery.  Write down your questions. Take them to your prenatal visits.  Keep all your prenatal visits as directed by your   health care provider. Safety  Wear your seat belt at all times when driving.  Make a list of emergency phone numbers, including numbers for family, friends, the hospital, and police and fire departments. General Tips  Ask your health care provider for a referral to a local prenatal education class. Begin classes no later than at the beginning of month 6 of your pregnancy.  Ask for help if you have counseling or nutritional needs during pregnancy. Your health care provider can offer advice or refer you to specialists for help with various needs.  Do not use hot tubs, steam rooms, or saunas.  Do not douche or use tampons or scented sanitary pads.  Do not cross your legs for long periods of time.  Avoid cat litter boxes and soil used by cats. These carry germs that can cause birth defects in the baby and possibly loss of the fetus by miscarriage or stillbirth.  Avoid all smoking, herbs, alcohol, and medicines not prescribed by  your health care provider. Chemicals in these affect the formation and growth of the baby.  Do not use any tobacco products, including cigarettes, chewing tobacco, and electronic cigarettes. If you need help quitting, ask your health care provider. You may receive counseling support and other resources to help you quit.  Schedule a dentist appointment. At home, brush your teeth with a soft toothbrush and be gentle when you floss. SEEK MEDICAL CARE IF:   You have dizziness.  You have mild pelvic cramps, pelvic pressure, or nagging pain in the abdominal area.  You have persistent nausea, vomiting, or diarrhea.  You have a bad smelling vaginal discharge.  You have pain with urination.  You notice increased swelling in your face, hands, legs, or ankles. SEEK IMMEDIATE MEDICAL CARE IF:   You have a fever.  You are leaking fluid from your vagina.  You have spotting or bleeding from your vagina.  You have severe abdominal cramping or pain.  You have rapid weight gain or loss.  You vomit blood or material that looks like coffee grounds.  You are exposed to German measles and have never had them.  You are exposed to fifth disease or chickenpox.  You develop a severe headache.  You have shortness of breath.  You have any kind of trauma, such as from a fall or a car accident.   This information is not intended to replace advice given to you by your health care provider. Make sure you discuss any questions you have with your health care provider.   Document Released: 12/16/2000 Document Revised: 01/12/2014 Document Reviewed: 11/01/2012 Elsevier Interactive Patient Education 2016 [employer]  

## 2015-01-02 NOTE — ED Notes (Signed)
Pt. Going home by self, will follow up with OB/GYN

## 2015-01-02 NOTE — ED Notes (Signed)
Patient called twice no answer, notified nurse Kathlee Nations.

## 2015-01-02 NOTE — ED Notes (Signed)
Pt informed waiting on [place].

## 2015-01-02 NOTE — ED Provider Notes (Signed)
Time Seen: Approximately 1720  I have reviewed the triage notes  Chief Complaint: Possible Pregnancy   History of Present Illness: Sarah Rivas is a 29 y.o. female *who presents with concerns over her lower abdominal cramping. Patient states she had a positive home pregnancy test approximately 2 weeks ago. She denies any vaginal bleeding or discharge. She points mainly to the right lower quadrant as the source of discomfort and states she also has a history of fibrocystic breast disease along with endometriosis. Patient denies any fever or flank pain. Patient is now currently gravida 5. Para 2 with 2 previous miscarriages Past Medical History  Diagnosis Date  . No pertinent past medical history     Patient Active Problem List   Diagnosis Date Noted  . URI 04/20/2008  . FIBROCYSTIC BREAST DISEASE 11/28/2007  . ABDOMINAL PAIN, RIGHT LOWER QUADRANT 06/15/2007  . HEARING LOSS 05/06/2007  . CHEST PAIN 05/06/2007  . COMMON MIGRAINE 09/01/2006  . DISORDER, MENSTRUAL NOS 09/01/2006  . ANXIETY 04/19/2006  . ANXIETY, SITUATIONAL 04/19/2006  . DEPRESSION 04/19/2006  . SYNCOPE 04/19/2006    Past Surgical History  Procedure Laterality Date  . Tonsillectomy      Past Surgical History  Procedure Laterality Date  . Tonsillectomy      Current Outpatient Rx  Name  Route  Sig  Dispense  Refill  . azithromycin (ZITHROMAX Z-PAK) 250 MG tablet      Take 2 tablets (500 mg) on  Day 1,  followed by 1 tablet (250 mg) once daily on Days 2 through 5.   6 each   0   . ibuprofen (ADVIL,MOTRIN) 800 MG tablet   Oral   Take 1 tablet (800 mg total) by mouth every 8 (eight) hours as needed.   30 tablet   0   . lidocaine (XYLOCAINE) 2 % solution   Mouth/Throat   Use as directed 20 mLs in the mouth or throat as needed for mouth pain.   100 mL   0     Allergies:  Codeine and Sertraline hcl  Family History: Family History  Problem Relation Age of Onset  . Anesthesia problems Neg Hx      Social History: Social History  Substance Use Topics  . Smoking status: Never Smoker   . Smokeless tobacco: None  . Alcohol Use: No     Review of Systems:   10 point review of systems was performed and was otherwise negative:  Constitutional: No fever Eyes: No visual disturbances ENT: No sore throat, ear pain Cardiac: No chest pain Respiratory: No shortness of breath, wheezing, or stridor Abdomen: Pain is mainly lower middle quadrant to this historian without any lateralization, no vomiting, No diarrhea no back or flank discomfort Endocrine: No weight loss, No night sweats Extremities: No peripheral edema, cyanosis Skin: No rashes, easy bruising Neurologic: No focal weakness, trouble with speech or swollowing Urologic: No dysuria, Hematuria, or urinary frequency No vaginal discharge or bleeding  Physical Exam:  ED Triage Vitals  Enc Vitals Group     BP 01/02/15 1330 108/61 mmHg     Pulse Rate 01/02/15 1330 95     Resp 01/02/15 1330 20     Temp 01/02/15 1330 98.2 F (36.8 C)     Temp Source 01/02/15 1330 Oral     SpO2 01/02/15 1330 98 %     Weight 01/02/15 1330 116 lb (52.617 kg)     Height 01/02/15 1330 5\' 3"  (1.6 m)  Head Cir --      Peak Flow --      Pain Score 01/02/15 1331 7     Pain Loc --      Pain Edu? --      Excl. in [place]? --     General: Awake , Alert , and Oriented times 3; GCS 15 Head: Normal cephalic , atraumatic Eyes: Pupils equal , round, reactive to light Nose/Throat: No nasal drainage, patent upper airway without erythema or exudate.  Neck: Supple, Full range of motion, No anterior adenopathy or palpable thyroid masses Lungs: Clear to ascultation without wheezes , rhonchi, or rales Heart: Regular rate, regular rhythm without murmurs , gallops , or rubs Abdomen: Soft, non tender without rebound, guarding , or rigidity; bowel sounds positive and symmetric in all 4 quadrants. No organomegaly .        Extremities: 2 plus symmetric pulses. No  edema, clubbing or cyanosis Neurologic: normal ambulation, Motor symmetric without deficits, sensory intact Skin: warm, dry, no rashes   Labs:   All laboratory work was reviewed including any pertinent negatives or positives listed below:  Labs Reviewed  HCG, QUANTITATIVE, PREGNANCY - Abnormal; Notable for the following:    hCG, Beta Chain, Quant, S 3798 (*)    All other components within normal limits  URINALYSIS COMPLETEWITH MICROSCOPIC ([facility] ONLY) - Abnormal; Notable for the following:    Color, Urine YELLOW (*)    APPearance TURBID (*)    Squamous Epithelial / LPF 0-5 (*)    All other components within normal limits   review of laboratory work shows no significant findings    Radiology:   CLINICAL DATA: 29 year old pregnant female with lower abdominal pain for 3 days. Quantitative beta HCG 3,798.  EDC by LMP: 09/03/2015, projecting to an expected gestational age of [redacted] weeks 1 day  EXAM: OBSTETRIC <14 WK [place] AND TRANSVAGINAL OB US  TECHNIQUE: Both transabdominal and transvaginal ultrasound examinations were performed for complete evaluation of the gestation as well as the maternal uterus, adnexal regions, and pelvic cul-de-sac. Transvaginal technique was performed to assess early pregnancy.  COMPARISON: No prior scans from this gestation.  FINDINGS: Intrauterine gestational sac: Single intrauterine gestational sac appears normal in size, shape and position.  Yolk sac: Present and normal.  Embryo: Not visualized.  Cardiac Activity: Not visualized.  MSD: 4.9 mm  5 w  2 d      [place] EDC: 09/02/2015  Subchorionic hemorrhage: None visualized.  Maternal uterus/adnexae: No suspicious ovarian or adnexal findings. Normal 2.1 cm right ovarian corpus luteum. No abnormal free fluid in the pelvis.  IMPRESSION: 1. Single intrauterine gestational sac with yolk sac at 5 weeks 2 days by mean sac diameter, concordant with the expected gestational age of [redacted]  weeks 1 day by provided menstrual dating. 2. No embryo detected within the gestational sac, probably due to early gestational age. Recommend follow-up obstetric scan in 2-3 weeks to establish viability. 3. No ovarian or adnexal abnormality.   Electronically Signed By: Ilona Sorrel M.D. On: 01/02/2015 19:28          US OB Transvaginal (Final result) Result time: 01/02/15 19:28:10   Final result by Rad Results In Interface (01/02/15 19:28:10)   Narrative:   CLINICAL DATA: 29 year old pregnant female with lower abdominal pain for 3 days. Quantitative beta HCG 3,798.  EDC by LMP: 09/03/2015, projecting to an expected gestational age of [redacted] weeks 1 day  EXAM: OBSTETRIC <14 WK [place] AND TRANSVAGINAL OB US  TECHNIQUE:  Both transabdominal and transvaginal ultrasound examinations were performed for complete evaluation of the gestation as well as the maternal uterus, adnexal regions, and pelvic cul-de-sac. Transvaginal technique was performed to assess early pregnancy.  COMPARISON: No prior scans from this gestation.  FINDINGS: Intrauterine gestational sac: Single intrauterine gestational sac appears normal in size, shape and position.  Yolk sac: Present and normal.  Embryo: Not visualized.  Cardiac Activity: Not visualized.  MSD: 4.9 mm  5 w  2 d      [place] EDC: 09/02/2015  Subchorionic hemorrhage: None visualized.  Maternal uterus/adnexae: No suspicious ovarian or adnexal findings. Normal 2.1 cm right ovarian corpus luteum. No abnormal free fluid in the pelvis.  IMPRESSION: 1. Single intrauterine gestational sac with yolk sac at 5 weeks 2 days by mean sac diameter, concordant with the expected gestational age of [redacted] weeks 1 day by provided menstrual dating. 2. No embryo detected within the gestational sac, probably due to early gestational age. Recommend follow-up obstetric scan in 2-3 weeks to establish viability. 3. No ovarian or adnexal  abnormality.        I personally reviewed the radiologic studies     ED Course: The patient's evaluation shows what appears to be a normal interuterine pregnancy per dates and there is no evidence of ectopic pregnancy. She also does not show any ovarian abnormalities such as an ovarian cyst, ovarian torsion, endometriosis, or any other concerns. Patient stated was uneventful and she was advised take Tylenol over-the-counter for pain   Assessment:  First trimester pregnancy  Final Clinical Impression:  Final diagnoses:  Pregnancy     Plan:  Outpatient management Patient was advised to return immediately if condition worsens. Patient was advised to follow up with their primary care physician or other specialized physicians involved in their outpatient care            Daymon Larsen, MD 01/02/15 [phone]

## 2015-01-02 NOTE — ED Notes (Signed)
abd cramps, had positive home preg test 2 weeks ago, no bleeding

## 2015-01-02 NOTE — ED Notes (Signed)
Patient transported to Ultrasound 

## 2015-01-06 NOTE — L&D Delivery Note (Signed)
Delivery Note Primary OB: [facility] Delivery Physician: Barnett Applebaum, MD Gestational Age: Full term Antepartum complications: none Intrapartum complications: None  A viable Female was delivered via vertex perentation.  Apgars:9 ,9  Weight:  6 lb 9 oz .   Placenta status: spontaneous and Intact.  Cord: 3+ vessels;  with the following complications: nuchal.  Anesthesia:  epidural Episiotomy:  none Lacerations:  none Suture Repair: none Est. Blood Loss (mL):  less than 100 mL  Mom to postpartum.  Baby to Couplet care / Skin to Skin.  Barnett Applebaum, MD Dept of OB/GYN [phone]

## 2015-02-06 LAB — OB RESULTS CONSOLE VARICELLA ZOSTER ANTIBODY, IGG: Varicella: IMMUNE

## 2015-02-06 LAB — OB RESULTS CONSOLE RUBELLA ANTIBODY, IGM: Rubella: IMMUNE

## 2015-02-06 LAB — OB RESULTS CONSOLE HIV ANTIBODY (ROUTINE TESTING): HIV: NONREACTIVE

## 2015-02-06 LAB — OB RESULTS CONSOLE HEPATITIS B SURFACE ANTIGEN: HEP B S AG: NEGATIVE

## 2015-02-06 LAB — OB RESULTS CONSOLE RPR: RPR: NONREACTIVE

## 2015-04-16 ENCOUNTER — Emergency Department
Admission: EM | Admit: 2015-04-16 | Discharge: 2015-04-16 | Disposition: A | Discharge status: Home / Self Care | Payer: Medicaid Other | Attending: Emergency Medicine | Admitting: Emergency Medicine

## 2015-04-16 ENCOUNTER — Encounter: Payer: Self-pay | Admitting: Emergency Medicine

## 2015-04-16 DIAGNOSIS — L089 Local infection of the skin and subcutaneous tissue, unspecified: Secondary | ICD-10-CM | POA: Diagnosis present

## 2015-04-16 DIAGNOSIS — O26892 Other specified pregnancy related conditions, second trimester: Secondary | ICD-10-CM | POA: Diagnosis not present

## 2015-04-16 DIAGNOSIS — L03116 Cellulitis of left lower limb: Secondary | ICD-10-CM | POA: Diagnosis not present

## 2015-04-16 DIAGNOSIS — Z3A20 20 weeks gestation of pregnancy: Secondary | ICD-10-CM | POA: Insufficient documentation

## 2015-04-16 MED ORDER — CEPHALEXIN 500 MG PO CAPS
500.0000 mg | ORAL_CAPSULE | Freq: Once | ORAL | Status: AC
  Administered 2015-04-16: 500 mg via ORAL
  Filled 2015-04-16: qty 1

## 2015-04-16 MED ORDER — CEPHALEXIN 500 MG PO CAPS: 500.0000 mg | ORAL_CAPSULE | Freq: Two times a day (BID) | ORAL | Status: AC

## 2015-04-16 MED ORDER — ACETAMINOPHEN 325 MG PO TABS
650.0000 mg | ORAL_TABLET | Freq: Once | ORAL | Status: AC
  Administered 2015-04-16: 650 mg via ORAL
  Filled 2015-04-16: qty 2

## 2015-04-16 NOTE — ED Notes (Signed)
[redacted] weeks pregnant Pt arrived to the ED for complaints of skin redness and pain secondary to a tick bite sustained 3 days ago. Pt states that she removed the tick. Pt is AOx4 in no apparent distress.

## 2015-04-16 NOTE — ED Provider Notes (Signed)
CSN: [id number]     Arrival date & time 04/16/15  2038 History   First MD Initiated Contact with Patient 04/16/15 2126     Chief Complaint  Patient presents with  . Tick Removal     HPI 30 year old female who presents to the emergency department for evaluation of a skin infection. She states that on Sunday he tick was removed from her left upper thigh/groin area and over the past 24 hours it has become red and painful. She denies drainage from the wound or a history of MRSA or skin infections. She has not taken any medications for this. She is approximately [redacted] weeks pregnant.  Past Medical History  Diagnosis Date  . No pertinent past medical history    Past Surgical History  Procedure Laterality Date  . Tonsillectomy     Family History  Problem Relation Age of Onset  . Anesthesia problems Neg Hx    Social History  Substance Use Topics  . Smoking status: Never Smoker   . Smokeless tobacco: None  . Alcohol Use: No   OB History    Gravida Para Term Preterm AB TAB SAB Ectopic Multiple Living   3 1 1  0 1 0 1 0 0 1     Review of Systems  Constitutional: Negative for fever, chills and activity change.  Respiratory: Negative.   Gastrointestinal: Negative for nausea and vomiting.  Musculoskeletal: Negative for myalgias, arthralgias and neck stiffness.  Skin: Positive for color change and wound.  Neurological: Negative.       Allergies  Codeine and Sertraline hcl  Home Medications   Prior to Admission medications   Medication Sig Start Date End Date Taking? Authorizing Provider  cephALEXin (KEFLEX) 500 MG capsule Take 1 capsule (500 mg total) by mouth 2 (two) times daily. 04/16/15 04/26/15  Victorino Dike, FNP   BP 99/73 mmHg  Pulse 91  Temp(Src) 98.4 F (36.9 C) (Oral)  Resp 16  Ht 5\' 3"  (1.6 m)  Wt 61.689 kg  BMI 24.10 kg/m2  SpO2 97%  LMP 11/27/2014 Physical Exam  Constitutional: She is oriented to person, place, and time. She appears well-developed and  well-nourished.  Eyes: EOM are normal.  Neck: Normal range of motion. Neck supple.  Pulmonary/Chest: Effort normal.  Musculoskeletal: Normal range of motion.  Neurological: She is alert and oriented to person, place, and time.  Skin: Skin is warm and dry. Lesion noted. No petechiae and no rash noted. She is not diaphoretic. There is erythema.     Psychiatric: She has a normal mood and affect. Her behavior is normal. Judgment and thought content normal.  Nursing note and vitals reviewed.   ED Course  Procedures (including critical care time) Labs Review Labs Reviewed - No data to display  Imaging Review No results found. I have personally reviewed and evaluated these images and lab results as part of my medical decision-making.   EKG Interpretation None      MDM   Final diagnoses:  Cellulitis of left lower extremity    Patient will be started on Keflex and advised to take Tylenol as needed for pain. She was instructed to follow-up with her primary care provider/OB/GYN for symptoms that are not improving over the next 48 hours. She was instructed to return to the emergency department for symptoms that change or worsen if she is unable schedule an appointment.    Victorino Dike, FNP 04/16/15 2359  Earleen Newport, MD 04/24/15 [phone]

## 2015-04-16 NOTE — Discharge Instructions (Signed)

## 2015-04-16 NOTE — ED Notes (Signed)
Pt in via triage w/ complaints of pain/swelling to left groin area; pt reports recent tick bite stating her husband pulled off a tick off her on Sunday.  Area around bite red, warm, swollen.  Pt [redacted] weeks pregnant.  No immediate distress at this time.

## 2015-08-09 LAB — OB RESULTS CONSOLE GC/CHLAMYDIA
Chlamydia: NEGATIVE
Gonorrhea: NEGATIVE

## 2015-08-09 LAB — OB RESULTS CONSOLE GBS: STREP GROUP B AG: NEGATIVE

## 2015-08-29 ENCOUNTER — Inpatient Hospital Stay: Payer: Medicaid Other | Admitting: Anesthesiology

## 2015-08-29 ENCOUNTER — Inpatient Hospital Stay
Admission: EM | Admit: 2015-08-29 | Discharge: 2015-08-31 | DRG: 775 | Disposition: A | Discharge status: Home / Self Care | Payer: Medicaid Other | Attending: Obstetrics & Gynecology | Admitting: Obstetrics & Gynecology

## 2015-08-29 DIAGNOSIS — O9902 Anemia complicating childbirth: Secondary | ICD-10-CM | POA: Diagnosis present

## 2015-08-29 DIAGNOSIS — D62 Acute posthemorrhagic anemia: Secondary | ICD-10-CM | POA: Diagnosis present

## 2015-08-29 DIAGNOSIS — Z3A39 39 weeks gestation of pregnancy: Secondary | ICD-10-CM

## 2015-08-29 LAB — TYPE AND SCREEN
ABO/RH(D): A POS
Antibody Screen: NEGATIVE

## 2015-08-29 LAB — CBC
HCT: 31.8 % — ABNORMAL LOW (ref 35.0–47.0)
Hemoglobin: 11.3 g/dL — ABNORMAL LOW (ref 12.0–16.0)
MCH: 32.7 pg (ref 26.0–34.0)
MCHC: 35.4 g/dL (ref 32.0–36.0)
MCV: 92.2 fL (ref 80.0–100.0)
PLATELETS: 216 10*3/uL (ref 150–440)
RBC: 3.45 MIL/uL — AB (ref 3.80–5.20)
RDW: 13.9 % (ref 11.5–14.5)
WBC: 12.9 10*3/uL — AB (ref 3.6–11.0)

## 2015-08-29 MED ORDER — AMMONIA AROMATIC IN INHA
RESPIRATORY_TRACT | Status: AC
  Filled 2015-08-29: qty 10

## 2015-08-29 MED ORDER — FENTANYL 2.5 MCG/ML W/ROPIVACAINE 0.2% IN NS 100 ML EPIDURAL INFUSION (ARMC-ANES)
EPIDURAL | Status: AC
  Filled 2015-08-29: qty 100

## 2015-08-29 MED ORDER — LACTATED RINGERS IV SOLN: 500.0000 mL | INTRAVENOUS | Status: DC | PRN

## 2015-08-29 MED ORDER — LACTATED RINGERS IV SOLN
INTRAVENOUS | Status: DC
  Administered 2015-08-29 (×2): via INTRAVENOUS

## 2015-08-29 MED ORDER — COCONUT OIL OIL
1.0000 "application " | TOPICAL_OIL | Status: DC | PRN
  Filled 2015-08-29 (×2): qty 120

## 2015-08-29 MED ORDER — IBUPROFEN 600 MG PO TABS
ORAL_TABLET | ORAL | Status: AC
Start: 2015-08-29 — End: 2015-08-29
  Administered 2015-08-29: 600 mg via ORAL
  Filled 2015-08-29: qty 1

## 2015-08-29 MED ORDER — DIBUCAINE 1 % RE OINT: 1.0000 "application " | TOPICAL_OINTMENT | RECTAL | Status: DC | PRN

## 2015-08-29 MED ORDER — TERBUTALINE SULFATE 1 MG/ML IJ SOLN: 0.2500 mg | Freq: Once | INTRAMUSCULAR | Status: DC | PRN

## 2015-08-29 MED ORDER — OXYTOCIN 40 UNITS IN LACTATED RINGERS INFUSION - SIMPLE MED
1.0000 m[IU]/min | INTRAVENOUS | Status: DC
  Administered 2015-08-29: 666 m[IU]/min via INTRAVENOUS
  Administered 2015-08-29: 1 m[IU]/min via INTRAVENOUS

## 2015-08-29 MED ORDER — LIDOCAINE-EPINEPHRINE (PF) 1.5 %-1:200000 IJ SOLN
INTRAMUSCULAR | Status: DC | PRN
  Administered 2015-08-29: 3 mL

## 2015-08-29 MED ORDER — ACETAMINOPHEN 325 MG PO TABS: 650.0000 mg | ORAL_TABLET | ORAL | Status: DC | PRN

## 2015-08-29 MED ORDER — OXYTOCIN BOLUS FROM INFUSION
500.0000 mL | Freq: Once | INTRAVENOUS | Status: DC
  Administered 2015-08-29: 500 mL via INTRAVENOUS

## 2015-08-29 MED ORDER — NALBUPHINE HCL 10 MG/ML IJ SOLN: 5.0000 mg | INTRAMUSCULAR | Status: DC | PRN

## 2015-08-29 MED ORDER — DIPHENHYDRAMINE HCL 50 MG/ML IJ SOLN: 12.5000 mg | INTRAMUSCULAR | Status: DC | PRN

## 2015-08-29 MED ORDER — NALBUPHINE HCL 10 MG/ML IJ SOLN: 5.0000 mg | Freq: Once | INTRAMUSCULAR | Status: DC | PRN

## 2015-08-29 MED ORDER — NALOXONE HCL 0.4 MG/ML IJ SOLN: 0.4000 mg | INTRAMUSCULAR | Status: DC | PRN

## 2015-08-29 MED ORDER — SODIUM CHLORIDE 0.9 % IV SOLN: 250.0000 mL | INTRAVENOUS | Status: DC | PRN

## 2015-08-29 MED ORDER — DIPHENHYDRAMINE HCL 25 MG PO CAPS: 25.0000 mg | ORAL_CAPSULE | Freq: Four times a day (QID) | ORAL | Status: DC | PRN

## 2015-08-29 MED ORDER — ONDANSETRON HCL 4 MG/2ML IJ SOLN: 4.0000 mg | Freq: Three times a day (TID) | INTRAMUSCULAR | Status: DC | PRN

## 2015-08-29 MED ORDER — BUTORPHANOL TARTRATE 1 MG/ML IJ SOLN: 1.0000 mg | INTRAMUSCULAR | Status: DC | PRN

## 2015-08-29 MED ORDER — MEPERIDINE HCL 25 MG/ML IJ SOLN: 6.2500 mg | INTRAMUSCULAR | Status: DC | PRN

## 2015-08-29 MED ORDER — MISOPROSTOL 25 MCG QUARTER TABLET
25.0000 ug | ORAL_TABLET | ORAL | Status: DC
  Administered 2015-08-29: 25 ug via VAGINAL

## 2015-08-29 MED ORDER — NALOXONE HCL 2 MG/2ML IJ SOSY
1.0000 ug/kg/h | PREFILLED_SYRINGE | INTRAVENOUS | Status: DC | PRN
  Filled 2015-08-29: qty 2

## 2015-08-29 MED ORDER — OXYTOCIN 40 UNITS IN LACTATED RINGERS INFUSION - SIMPLE MED
2.5000 [IU]/h | INTRAVENOUS | Status: DC
  Filled 2015-08-29: qty 1000

## 2015-08-29 MED ORDER — ONDANSETRON HCL 4 MG/2ML IJ SOLN: 4.0000 mg | INTRAMUSCULAR | Status: DC | PRN

## 2015-08-29 MED ORDER — BUPIVACAINE HCL (PF) 0.25 % IJ SOLN
INTRAMUSCULAR | Status: DC | PRN
  Administered 2015-08-29: 5 mL via EPIDURAL
  Administered 2015-08-29: 3 mL via EPIDURAL

## 2015-08-29 MED ORDER — SIMETHICONE 80 MG PO CHEW: 80.0000 mg | CHEWABLE_TABLET | ORAL | Status: DC | PRN

## 2015-08-29 MED ORDER — KETOROLAC TROMETHAMINE 30 MG/ML IJ SOLN: 30.0000 mg | Freq: Four times a day (QID) | INTRAMUSCULAR | Status: DC | PRN

## 2015-08-29 MED ORDER — CALCIUM CARBONATE ANTACID 500 MG PO CHEW
1.0000 | CHEWABLE_TABLET | Freq: Once | ORAL | Status: AC
  Administered 2015-08-29: 200 mg via ORAL
  Filled 2015-08-29: qty 1

## 2015-08-29 MED ORDER — SCOPOLAMINE 1 MG/3DAYS TD PT72: 1.0000 | MEDICATED_PATCH | Freq: Once | TRANSDERMAL | Status: DC

## 2015-08-29 MED ORDER — IBUPROFEN 600 MG PO TABS
600.0000 mg | ORAL_TABLET | Freq: Four times a day (QID) | ORAL | Status: DC
  Administered 2015-08-29 – 2015-08-31 (×3): 600 mg via ORAL
  Filled 2015-08-29: qty 1

## 2015-08-29 MED ORDER — ONDANSETRON HCL 4 MG/2ML IJ SOLN
4.0000 mg | Freq: Four times a day (QID) | INTRAMUSCULAR | Status: DC | PRN
  Administered 2015-08-29: 4 mg via INTRAVENOUS
  Filled 2015-08-29: qty 2

## 2015-08-29 MED ORDER — DIPHENHYDRAMINE HCL 25 MG PO CAPS: 25.0000 mg | ORAL_CAPSULE | ORAL | Status: DC | PRN

## 2015-08-29 MED ORDER — SODIUM CHLORIDE FLUSH 0.9 % IV SOLN
INTRAVENOUS | Status: AC
  Administered 2015-08-29: 3 mL via INTRAVENOUS
  Filled 2015-08-29: qty 20

## 2015-08-29 MED ORDER — MISOPROSTOL 25 MCG QUARTER TABLET
ORAL_TABLET | ORAL | Status: AC
  Administered 2015-08-29: 25 ug via VAGINAL
  Filled 2015-08-29: qty 0.25

## 2015-08-29 MED ORDER — SODIUM CHLORIDE 0.9% FLUSH
3.0000 mL | INTRAVENOUS | Status: DC | PRN
  Administered 2015-08-29: 3 mL via INTRAVENOUS
  Filled 2015-08-29: qty 3

## 2015-08-29 MED ORDER — FENTANYL 2.5 MCG/ML W/ROPIVACAINE 0.2% IN NS 100 ML EPIDURAL INFUSION (ARMC-ANES): 10.0000 mL/h | EPIDURAL | Status: DC

## 2015-08-29 MED ORDER — ZOLPIDEM TARTRATE 5 MG PO TABS: 5.0000 mg | ORAL_TABLET | Freq: Every evening | ORAL | Status: DC | PRN

## 2015-08-29 MED ORDER — SODIUM CHLORIDE 0.9% FLUSH: 3.0000 mL | INTRAVENOUS | Status: DC | PRN

## 2015-08-29 MED ORDER — BENZOCAINE-MENTHOL 20-0.5 % EX AERO
INHALATION_SPRAY | CUTANEOUS | Status: AC
  Administered 2015-08-29: 23:00:00
  Filled 2015-08-29: qty 56

## 2015-08-29 MED ORDER — MISOPROSTOL 200 MCG PO TABS
ORAL_TABLET | ORAL | Status: AC
  Filled 2015-08-29: qty 4

## 2015-08-29 MED ORDER — LIDOCAINE HCL (PF) 1 % IJ SOLN
INTRAMUSCULAR | Status: AC
  Filled 2015-08-29: qty 30

## 2015-08-29 MED ORDER — FENTANYL 2.5 MCG/ML W/ROPIVACAINE 0.2% IN NS 100 ML EPIDURAL INFUSION (ARMC-ANES)
EPIDURAL | Status: DC | PRN
  Administered 2015-08-29: 10 mL/h via EPIDURAL

## 2015-08-29 MED ORDER — WITCH HAZEL-GLYCERIN EX PADS: 1.0000 "application " | MEDICATED_PAD | CUTANEOUS | Status: DC | PRN

## 2015-08-29 MED ORDER — ONDANSETRON HCL 4 MG PO TABS: 4.0000 mg | ORAL_TABLET | ORAL | Status: DC | PRN

## 2015-08-29 MED ORDER — SODIUM CHLORIDE 0.9% FLUSH: 3.0000 mL | Freq: Two times a day (BID) | INTRAVENOUS | Status: DC

## 2015-08-29 MED ORDER — LIDOCAINE HCL (PF) 1 % IJ SOLN
INTRAMUSCULAR | Status: DC | PRN
  Administered 2015-08-29: 3 mL via SUBCUTANEOUS

## 2015-08-29 MED ORDER — SENNOSIDES-DOCUSATE SODIUM 8.6-50 MG PO TABS
2.0000 | ORAL_TABLET | ORAL | Status: DC
  Administered 2015-08-30: 2 via ORAL
  Filled 2015-08-29: qty 2

## 2015-08-29 MED ORDER — BENZOCAINE-MENTHOL 20-0.5 % EX AERO
1.0000 "application " | INHALATION_SPRAY | CUTANEOUS | Status: DC | PRN
  Administered 2015-08-29: 1 via TOPICAL

## 2015-08-29 NOTE — Anesthesia Procedure Notes (Signed)
Epidural  Start time: 08/29/2015 5:35 PM End time: 08/29/2015 5:55 PM  Staffing Anesthesiologist: Gunnar Bulla Resident/CRNA: Aline Brochure Performed: resident/CRNA   Preanesthetic Checklist Completed: patient identified, site marked, surgical consent, pre-op evaluation, IV checked, risks and benefits discussed and monitors and equipment checked  Epidural Patient position: sitting Prep: Betadine Patient monitoring: heart rate, continuous pulse ox and blood pressure Approach: midline Location: L3-L4 Injection technique: LOR air  Needle:  Needle type: Tuohy  Needle gauge: 17 G Needle length: 9 cm Needle insertion depth: 6 cm Catheter type: closed end flexible Catheter size: 19 Gauge Catheter at skin depth: 10 cm Test dose: negative and 1.5% lidocaine with Epi 1:200 K  Assessment Events: blood not aspirated, injection not painful, no injection resistance, negative IV test and no paresthesia  Additional Notes Reason for block:procedure for pain

## 2015-08-29 NOTE — Progress Notes (Signed)
L&D Note  08/29/2015 - 9:05 AM  30 y.o. [id number] [redacted]w[redacted]d   [name] is admitted for IOL for advanced dilation and h/o rapid labor   Subjective:  Denies regular ctx, LOF, VB. Reports +FM Objective:   Vitals:   08/29/15 0753  BP: 103/68  Pulse: 92  Resp: 18  Temp: 98 F (36.7 C)  TempSrc: Oral  Weight: 152 lb (68.9 kg)  Height: 5\' 3"  (1.6 m)    Current Vital Signs 24h Vital Sign Ranges  T 98 F (36.7 C) Temp  Avg: 98 F (36.7 C)  Min: 98 F (36.7 C)  Max: 98 F (36.7 C)  BP 103/68 BP  Min: 103/68  Max: 103/68  HR 92 Pulse  Avg: 92  Min: 92  Max: 92  RR 18 Resp  Avg: 18  Min: 18  Max: 18  SaO2     No Data Recorded       24 Hour I/O Current Shift I/O  Time Ins Outs No intake/output data recorded. No intake/output data recorded.    FHR: cat 1 tracing Toco: rare ctx SVE: 3/60/-2/posterior/medium, bishop score = 6   Assessment :  IUP at [redacted]w[redacted]d, IOL for h/o rapid labor and advanced dilation    Plan:  Cytotec 25 mcg placed vaginally Recheck in 4 hrs, proceed with IOL as indicated by cervical exam  Burlene Arnt, [place]

## 2015-08-29 NOTE — Progress Notes (Signed)
L&D Note  08/29/2015 - 5:27 PM  30 y.o. [id number] [redacted]w[redacted]d   [name] is admitted for IOL for advanced dilation and h/o rapid labor   Subjective:  PAINFUL regular ctx,  Yet no LOF, VB. Reports +FM Objective:   Vitals:   08/29/15 0753 08/29/15 1118  BP: 103/68 (!) 94/52  Pulse: 92 64  Resp: 18 18  Temp: 98 F (36.7 C) 97.8 F (36.6 C)  TempSrc: Oral Oral  Weight: 152 lb (68.9 kg)   Height: 5\' 3"  (1.6 m)     Current Vital Signs 24h Vital Sign Ranges  T 97.8 F (36.6 C) Temp  Avg: 97.9 F (36.6 C)  Min: 97.8 F (36.6 C)  Max: 98 F (36.7 C)  BP (!) 94/52 BP  Min: 94/52  Max: 103/68  HR 64 Pulse  Avg: 78  Min: 64  Max: 92  RR 18 Resp  Avg: 18  Min: 18  Max: 18  SaO2     No Data Recorded        FHR: cat 1 tracing Toco: ctx q 3-5 min SVE: 5/75/-2  Assessment :  IUP at [redacted]w[redacted]d, IOL for h/o rapid labor and advanced dilation    Plan:  Progressing.  Epidural.  Pitocin.  AROM after epidural. FWR  Hoyt Koch, MD

## 2015-08-29 NOTE — Anesthesia Preprocedure Evaluation (Signed)
Anesthesia Evaluation  Patient identified by MRN, date of birth, ID band Patient awake    Reviewed: Allergy & Precautions, NPO status , Patient's Chart, lab work & pertinent test results, reviewed documented beta blocker date and time   Airway Mallampati: II  TM Distance: >3 FB     Dental  (+) Chipped   Pulmonary           Cardiovascular      Neuro/Psych  Headaches, PSYCHIATRIC DISORDERS Anxiety Depression    GI/Hepatic   Endo/Other    Renal/GU      Musculoskeletal   Abdominal   Peds  Hematology   Anesthesia Other Findings   Reproductive/Obstetrics                             Anesthesia Physical Anesthesia Plan  ASA: II  Anesthesia Plan: Epidural   Post-op Pain Management:    Induction:   Airway Management Planned:   Additional Equipment:   Intra-op Plan:   Post-operative Plan:   Informed Consent: I have reviewed the patients History and Physical, chart, labs and discussed the procedure including the risks, benefits and alternatives for the proposed anesthesia with the patient or authorized representative who has indicated his/her understanding and acceptance.     Plan Discussed with: CRNA  Anesthesia Plan Comments:         Anesthesia Quick Evaluation

## 2015-08-29 NOTE — Discharge Summary (Signed)
Obstetrical Discharge Summary  Date of Admission: 08/29/2015 Date of Discharge: 8/26  Discharge Diagnosis: Term Pregnancy-delivered Primary OB:  [facility]   Gestational Age at Delivery: [redacted]w[redacted]d  Antepartum complications: none Date of Delivery: 08/31/2015  Delivered By: Barnett Applebaum, MD Delivery Type: spontaneous vaginal delivery Intrapartum complications/course: None Anesthesia: epidural Placenta: spontaneous Laceration: none Episiotomy: none Live born Female ([place]) Birth Weight:  6-9 APGAR: 9, 9   Post partum course: Since the delivery, patient has tolerate activity, diet, and daily functions without difficulty or complication.  Min lochia.  No breast concerns at this time.  No signs of depression currently.   Postpartum Exam:General appearance: alert, cooperative and no distress GI: soft, non-tender; bowel sounds normal; no masses,  no organomegaly and Fundus firm Extremities: extremities normal, atraumatic, no cyanosis or edema Breast: normal in size and symmetry  Disposition: home with infant Rh Immune globulin given: not applicable Rubella vaccine given: not applicable Varicella vaccine given: not applicable Tdap vaccine given in AP or PP setting: given during prenatal care Flu vaccine given in AP or PP setting: given during prenatal care Contraception: condoms  Prenatal Labs: A POS//Rubella Immune//RPR negative//HIV negative/HepB Surface Ag negative//plans to breastfeed  Plan:  JC YARWOOD was discharged to home in good condition. Follow-up appointment with [facility] provider in 6 weeks  Discharge Medications:   Medication List    TAKE these medications   prenatal multivitamin Tabs tablet Take 1 tablet by mouth daily at 12 noon.

## 2015-08-29 NOTE — Progress Notes (Signed)
L&D Note  08/29/2015 - 2:20 PM  30 y.o. [id number] [redacted]w[redacted]d   [name] is admitted for IOL for advanced dilation and h/o rapid labor   Subjective:  Denies regular ctx, LOF, VB. Reports +FM Objective:   Vitals:   08/29/15 0753 08/29/15 1118  BP: 103/68 (!) 94/52  Pulse: 92 64  Resp: 18 18  Temp: 98 F (36.7 C) 97.8 F (36.6 C)  TempSrc: Oral Oral  Weight: 152 lb (68.9 kg)   Height: 5\' 3"  (1.6 m)     Current Vital Signs 24h Vital Sign Ranges  T 97.8 F (36.6 C) Temp  Avg: 97.9 F (36.6 C)  Min: 97.8 F (36.6 C)  Max: 98 F (36.7 C)  BP (!) 94/52 BP  Min: 94/52  Max: 103/68  HR 64 Pulse  Avg: 78  Min: 64  Max: 92  RR 18 Resp  Avg: 18  Min: 18  Max: 18  SaO2     No Data Recorded       24 Hour I/O Current Shift I/O  Time Ins Outs No intake/output data recorded. 08/24 0701 - 08/24 1900 In: 389.6 [I.V.:389.6] Out: -     FHR: cat 1 tracing Toco: ctx q 3-5 min SVE: 3/60/-2/posterior/medium, bishop score = 6   Assessment :  IUP at [redacted]w[redacted]d, IOL for h/o rapid labor and advanced dilation    Plan:  Contracting but no cervical change (one dose of Cytotec) Will change to Pitocin now.  AROM when appropriate.  Hoyt Koch, MD

## 2015-08-29 NOTE — H&P (Signed)
History and Physical Interval Note:  08/29/2015 7:44 AM  Sarah Rivas  has presented today for INDUCTION OF LABOR (cervical ripening agents),  with the diagnosis of 39+ weeks, Advanced Cervical Dilation, History of Fast Labor. The various methods of treatment have been discussed with the patient and family. After consideration of risks, benefits and other options for treatment, the patient has consented to  Labor induction .  The patient's history has been reviewed, patient examined, no change in status, and is stable for induction as planned.  See H&P. I have reviewed the patient's chart and labs.  Questions were answered to the patient's satisfaction.    Hoyt Koch

## 2015-08-30 LAB — RPR: RPR: NONREACTIVE

## 2015-08-30 LAB — CBC
HCT: 31.1 % — ABNORMAL LOW (ref 35.0–47.0)
Hemoglobin: 10.9 g/dL — ABNORMAL LOW (ref 12.0–16.0)
MCH: 32.6 pg (ref 26.0–34.0)
MCHC: 35.1 g/dL (ref 32.0–36.0)
MCV: 92.8 fL (ref 80.0–100.0)
PLATELETS: 187 10*3/uL (ref 150–440)
RBC: 3.35 MIL/uL — AB (ref 3.80–5.20)
RDW: 13.6 % (ref 11.5–14.5)
WBC: 22 10*3/uL — AB (ref 3.6–11.0)

## 2015-08-30 NOTE — Progress Notes (Signed)
  Subjective:  Doing well, minimal to moderate lochia  Objective:   Blood pressure (!) 99/54, pulse (!) 53, temperature 98.7 F (37.1 C), temperature source Oral, resp. rate 18, height 5\' 3"  (1.6 m), weight 68.9 kg (152 lb), last menstrual period 11/27/2014, unknown if currently breastfeeding.  General: NAD Pulmonary: no increased work of breathing Abdomen: non-distended, non-tender, fundus firm at level of umbilicus Extremities: no edema, no erythema, no tenderness  Results for orders placed or performed during the hospital encounter of 08/29/15 (from the past 72 hour(s))  CBC     Status: Abnormal   Collection Time: 08/29/15  8:14 AM  Result Value Ref Range   WBC 12.9 (H) 3.6 - 11.0 K/uL   RBC 3.45 (L) 3.80 - 5.20 MIL/uL   Hemoglobin 11.3 (L) 12.0 - 16.0 g/dL   HCT 31.8 (L) 35.0 - 47.0 %   MCV 92.2 80.0 - 100.0 fL   MCH 32.7 26.0 - 34.0 pg   MCHC 35.4 32.0 - 36.0 g/dL   RDW 13.9 11.5 - 14.5 %   Platelets 216 150 - 440 K/uL  Type and screen [place]     Status: None   Collection Time: 08/29/15  8:14 AM  Result Value Ref Range   ABO/RH(D) A POS    Antibody Screen NEG    Sample Expiration 09/01/2015   RPR     Status: None   Collection Time: 08/29/15  8:14 AM  Result Value Ref Range   RPR Ser Ql Non Reactive Non Reactive    Comment: (NOTE) Performed At: [facility] [place], [place] [id number] Lindon Romp MD [id number]   CBC     Status: Abnormal   Collection Time: 08/30/15  5:14 AM  Result Value Ref Range   WBC 22.0 (H) 3.6 - 11.0 K/uL   RBC 3.35 (L) 3.80 - 5.20 MIL/uL   Hemoglobin 10.9 (L) 12.0 - 16.0 g/dL   HCT 31.1 (L) 35.0 - 47.0 %   MCV 92.8 80.0 - 100.0 fL   MCH 32.6 26.0 - 34.0 pg   MCHC 35.1 32.0 - 36.0 g/dL   RDW 13.6 11.5 - 14.5 %   Platelets 187 150 - 440 K/uL    Assessment:   30 y.o. [id number] postpartum day # 1 TSVD  Plan:    1) Acute blood loss anemia - hemodynamically stable and  asymptomatic - po ferrous sulfate  2) --/--/A POS (08/24 [id number]) / Rubella Immune (02/01 0000) / Varicella Immune  3) TDAP status UTD  4) Breast/Condoms  5) Disposition anticipate discharge PPD2

## 2015-08-30 NOTE — Anesthesia Postprocedure Evaluation (Signed)
Anesthesia Post Note  Patient: Sarah Rivas  Procedure(s) Performed: * No procedures listed *  Anesthesia Post Evaluation  Last Vitals:  Vitals:   08/30/15 0026 08/30/15 0412  BP: (!) 102/45 (!) 115/59  Pulse: (!) 54 (!) 57  Resp: 18 20  Temp: 36.9 C 36.4 C    Last Pain:  Vitals:   08/30/15 0412  TempSrc: Oral  PainSc:                  Ricki Miller

## 2015-08-30 NOTE — Anesthesia Postprocedure Evaluation (Signed)
Anesthesia Post Note  Patient: Sarah Rivas  Procedure(s) Performed: * No procedures listed *  Patient location during evaluation: Mother Baby Anesthesia Type: Epidural Level of consciousness: awake, awake and alert and oriented Pain management: pain level controlled Vital Signs Assessment: post-procedure vital signs reviewed and stable Respiratory status: spontaneous breathing, nonlabored ventilation and respiratory function stable Cardiovascular status: blood pressure returned to baseline and stable Postop Assessment: no headache Anesthetic complications: no    Last Vitals:  Vitals:   08/30/15 0026 08/30/15 0412  BP: (!) 102/45 (!) 115/59  Pulse: (!) 54 (!) 57  Resp: 18 20  Temp: 36.9 C 36.4 C    Last Pain:  Vitals:   08/30/15 0412  TempSrc: Oral  PainSc:                  Ricki Miller

## 2015-08-30 NOTE — Plan of Care (Signed)
Problem: Nutritional: Goal: Mother's verbalization of comfort with breastfeeding process will improve Outcome: Progressing Coconut oil and gel packs given to mother, verbal encouragement given  Problem: Pain Management: Goal: General experience of comfort will improve and pain level will decrease Outcome: Completed/Met Date Met: 08/30/15 Pt not taking pain meds. Denies pain

## 2015-08-31 NOTE — Progress Notes (Signed)
  Subjective:  Doing well, minimal to moderate lochia  Objective:   Blood pressure 100/60, pulse 60, temperature 98.7 F (37.1 C), temperature source Oral, resp. rate 18, height 5\' 3"  (1.6 m), weight 68.9 kg (152 lb), last menstrual period 11/27/2014, currently breastfeeding.  General: NAD Pulmonary: no increased work of breathing Abdomen: non-distended, non-tender, fundus firm at level of umbilicus Extremities: no edema, no erythema, no tenderness  Results for orders placed or performed during the hospital encounter of 08/29/15 (from the past 72 hour(s))  CBC     Status: Abnormal   Collection Time: 08/29/15  8:14 AM  Result Value Ref Range   WBC 12.9 (H) 3.6 - 11.0 K/uL   RBC 3.45 (L) 3.80 - 5.20 MIL/uL   Hemoglobin 11.3 (L) 12.0 - 16.0 g/dL   HCT 31.8 (L) 35.0 - 47.0 %   MCV 92.2 80.0 - 100.0 fL   MCH 32.7 26.0 - 34.0 pg   MCHC 35.4 32.0 - 36.0 g/dL   RDW 13.9 11.5 - 14.5 %   Platelets 216 150 - 440 K/uL  Type and screen [place]     Status: None   Collection Time: 08/29/15  8:14 AM  Result Value Ref Range   ABO/RH(D) A POS    Antibody Screen NEG    Sample Expiration 09/01/2015   RPR     Status: None   Collection Time: 08/29/15  8:14 AM  Result Value Ref Range   RPR Ser Ql Non Reactive Non Reactive    Comment: (NOTE) Performed At: [facility] [place], [place] [id number] Lindon Romp MD [id number]   CBC     Status: Abnormal   Collection Time: 08/30/15  5:14 AM  Result Value Ref Range   WBC 22.0 (H) 3.6 - 11.0 K/uL   RBC 3.35 (L) 3.80 - 5.20 MIL/uL   Hemoglobin 10.9 (L) 12.0 - 16.0 g/dL   HCT 31.1 (L) 35.0 - 47.0 %   MCV 92.8 80.0 - 100.0 fL   MCH 32.6 26.0 - 34.0 pg   MCHC 35.1 32.0 - 36.0 g/dL   RDW 13.6 11.5 - 14.5 %   Platelets 187 150 - 440 K/uL    Assessment:   30 y.o. [id number] postpartum day # 1 TSVD  Plan:    1) Acute blood loss anemia - hemodynamically stable and asymptomatic - po ferrous  sulfate  2) --/--/A POS (08/24 [id number]) / Rubella Immune (02/01 0000) / Varicella Immune  3) TDAP status UTD  4) Breast/Condoms  5) Disposition discharge PPD2

## 2015-08-31 NOTE — Progress Notes (Signed)
D/C order from MD.  Reviewed d/c instructions and prescriptions with patient and answered any questions.  Patient d/c home with infant via wheelchair by nursing/auxillary. 

## 2015-08-31 NOTE — Discharge Instructions (Signed)
Please call your doctor or return to the ER if you experience any chest pains, shortness of breath, fever greater than 101, any heavy bleeding or large clots, and foul smelling vaginal discharge, any worsening abdominal pain & cramping that is not controlled by pain medication, or any signs of post partum depression.  No tampons, enemas, douches, or sexual intercourse for 6 weeks.  Also avoid tub baths, hot tubs, or swimming for 6 weeks.  Vaginal Delivery, Care After Refer to this sheet in the next few weeks. These discharge instructions provide you with information on caring for yourself after delivery. Your health care provider may also give you specific instructions. Your treatment has been planned according to the most current medical practices available, but problems sometimes occur. Call your health care provider if you have any problems or questions after you go home. HOME CARE INSTRUCTIONS  Take over-the-counter or prescription medicines only as directed by your health care provider or pharmacist.  Do not drink alcohol, especially if you are breastfeeding or taking medicine to relieve pain.  Do not chew or smoke tobacco.  Do not use illegal drugs.  Continue to use good perineal care. Good perineal care includes:  Wiping your perineum from front to back.  Keeping your perineum clean.  Do not use tampons or douche until your health care provider says it is okay.  Shower, wash your hair, and take tub baths as directed by your health care provider.  Wear a well-fitting bra that provides breast support.  Eat healthy foods.  Drink enough fluids to keep your urine clear or pale yellow.  Eat high-fiber foods such as whole grain cereals and breads, brown rice, beans, and fresh fruits and vegetables every day. These foods may help prevent or relieve constipation.  Follow your health care provider's recommendations regarding resumption of activities such as climbing stairs, driving,  lifting, exercising, or traveling.  Talk to your health care provider about resuming sexual activities. Resumption of sexual activities is dependent upon your risk of infection, your rate of healing, and your comfort and desire to resume sexual activity.  Try to have someone help you with your household activities and your newborn for at least a few days after you leave the hospital.  Rest as much as possible. Try to rest or take a nap when your newborn is sleeping.  Increase your activities gradually.  Keep all of your scheduled postpartum appointments. It is very important to keep your scheduled follow-up appointments. At these appointments, your health care provider will be checking to make sure that you are healing physically and emotionally. SEEK MEDICAL CARE IF:   You are passing large clots from your vagina. Save any clots to show your health care provider.  You have a foul smelling discharge from your vagina.  You have trouble urinating.  You are urinating frequently.  You have pain when you urinate.  You have a change in your bowel movements.  You have increasing redness, pain, or swelling near your vaginal incision (episiotomy) or vaginal tear.  You have pus draining from your episiotomy or vaginal tear.  Your episiotomy or vaginal tear is separating.  You have painful, hard, or reddened breasts.  You have a severe headache.  You have blurred vision or see spots.  You feel sad or depressed.  You have thoughts of hurting yourself or your newborn.  You have questions about your care, the care of your newborn, or medicines.  You are dizzy or light-headed.  You  have a rash.  You have nausea or vomiting.  You were breastfeeding and have not had a menstrual period within 12 weeks after you stopped breastfeeding.  You are not breastfeeding and have not had a menstrual period by the 12th week after delivery.  You have a fever. SEEK IMMEDIATE MEDICAL CARE IF:     You have persistent pain.  You have chest pain.  You have shortness of breath.  You faint.  You have leg pain.  You have stomach pain.  Your vaginal bleeding saturates two or more sanitary pads in 1 hour.   This information is not intended to replace advice given to you by your health care provider. Make sure you discuss any questions you have with your health care provider.   Document Released: 12/20/1999 Document Revised: 09/12/2014 Document Reviewed: 08/19/2011 Elsevier Interactive Patient Education [employer].

## 2016-02-16 ENCOUNTER — Emergency Department (HOSPITAL_COMMUNITY)
Admission: EM | Admit: 2016-02-16 | Discharge: 2016-02-16 | Disposition: A | Discharge status: Home / Self Care | Payer: Medicaid Other | Attending: Emergency Medicine | Admitting: Emergency Medicine

## 2016-02-16 ENCOUNTER — Encounter (HOSPITAL_COMMUNITY): Payer: Self-pay | Admitting: Emergency Medicine

## 2016-02-16 DIAGNOSIS — H9201 Otalgia, right ear: Secondary | ICD-10-CM | POA: Diagnosis present

## 2016-02-16 DIAGNOSIS — L723 Sebaceous cyst: Secondary | ICD-10-CM | POA: Diagnosis not present

## 2016-02-16 MED ORDER — CLINDAMYCIN HCL 300 MG PO CAPS: 300.0000 mg | ORAL_CAPSULE | Freq: Three times a day (TID) | ORAL | 0 refills | Status: AC

## 2016-02-16 NOTE — ED Notes (Signed)
Declined W/C at D/C and was escorted to lobby by RN. 

## 2016-02-16 NOTE — ED Triage Notes (Signed)
Pt reports knot to top of right ear ongoing x 1 year. Past 3 days the knot has increased causing pain to right side of head.

## 2016-02-16 NOTE — ED Provider Notes (Signed)
[facility] DEPT Provider Note   CSN: [id number] Arrival date & time: 02/16/16  1127  By signing my name below, I, Sarah Rivas, attest that this documentation has been prepared under the direction and in the presence of  [address] [employer]. Electronically Signed: Hansel Rivas, ED Scribe. 02/16/16. 12:52 PM.    History   Chief Complaint Chief Complaint  Patient presents with  . Otalgia    HPI Sarah Rivas is a 31 y.o. female who presents to the Emergency Department complaining of a painful area of swelling on the right ear for the last year that has gotten more swollen and painful over the last 3 days. She states she's had cysts to the posterior R ear in the past. Pt reports associated fever (Tmax 101) that began last night, but after taking tylenol last night the fever resolved and has not returned. She describes her pain as 8/10 constant, throbbing, R lateral face/ear pain with radiation to her R head and face, and worsened with talking, chewing, and touching the area. She reports no relief of pain with Tylenol, Motrin, or ice. No other treatments tried PTA, no known alleviating factors. She denies redness, warmth, drainage from the area, inner ear pain or drainage, visual disturbance, chills, CP, SOB, abd pain, N/V/D/C, hematuria, dysuria, myalgias, arthralgias, numbness, tingling, weakness, or any other complaints at this time. No known skin injury or trauma to the area.   The history is provided by the patient and medical records. No language interpreter was used.  Abscess  Location:  Face Facial abscess location: right ear. Size:  ~1cm Abscess quality: painful   Abscess quality: not draining, no redness and no warmth   Red streaking: no   Duration:  12 months (1 year, worse x3 days) Progression:  Worsening Pain details:    Quality:  Throbbing   Severity:  Severe   Duration:  3 days   Timing:  Constant   Progression:  Worsening Chronicity:  Chronic Context: not  immunosuppression and not skin injury   Relieved by:  Nothing Exacerbated by: touch, chewing, swallowing. Ineffective treatments:  Cold compresses and NSAIDs (and Tylenol and Motrin) Associated symptoms: fever (101.0 last night)   Associated symptoms: no nausea   Risk factors comment:  Prior cysts behind R ear   Past Medical History:  Diagnosis Date  . No pertinent past medical history     Patient Active Problem List   Diagnosis Date Noted  . Normal labor 08/29/2015  . Normal labor and delivery 08/29/2015  . URI 04/20/2008  . FIBROCYSTIC BREAST DISEASE 11/28/2007  . ABDOMINAL PAIN, RIGHT LOWER QUADRANT 06/15/2007  . HEARING LOSS 05/06/2007  . CHEST PAIN 05/06/2007  . COMMON MIGRAINE 09/01/2006  . DISORDER, MENSTRUAL NOS 09/01/2006  . ANXIETY 04/19/2006  . ANXIETY, SITUATIONAL 04/19/2006  . DEPRESSION 04/19/2006  . SYNCOPE 04/19/2006    Past Surgical History:  Procedure Laterality Date  . DILATION AND CURETTAGE OF UTERUS    . TONSILLECTOMY      OB History    Gravida Para Term Preterm AB Living   4 3 3  0 1 3   SAB TAB Ectopic Multiple Live Births   1 0 0 0 3       Home Medications    Prior to Admission medications   Medication Sig Start Date End Date Taking? Authorizing Provider  acetaminophen (TYLENOL) 325 MG tablet Take 650 mg by mouth every 6 (six) hours as needed for mild pain or headache.   Yes  Historical Provider, MD    Family History Family History  Problem Relation Age of Onset  . Anesthesia problems Neg Hx     Social History Social History  Substance Use Topics  . Smoking status: Never Smoker  . Smokeless tobacco: Never Used  . Alcohol use No     Allergies   Codeine and Sertraline hcl   Review of Systems Review of Systems  Constitutional: Positive for fever (101.0 last night). Negative for chills.  HENT: Negative for ear discharge and ear pain.        +painful area of swelling R external ear No drainage or warmth  Eyes: Negative for  visual disturbance.  Respiratory: Negative for shortness of breath.   Cardiovascular: Negative for chest pain.  Gastrointestinal: Negative for constipation and nausea.  Genitourinary: Negative for dysuria and hematuria.  Musculoskeletal: Negative for arthralgias and myalgias.  Skin: Negative for color change.  Allergic/Immunologic: Negative for immunocompromised state.  Neurological: Negative for weakness and numbness.  Psychiatric/Behavioral: Negative for confusion.    A complete 10 system review of systems was obtained and all systems are negative except as noted in the HPI and PMH.    Physical Exam Updated Vital Signs BP 108/72   Pulse 91   Temp 98.2 F (36.8 C) (Oral)   Resp 18   Ht 5\' 3"  (1.6 m)   Wt 135 lb (61.2 kg)   LMP 01/26/2016   SpO2 99%   Breastfeeding? No   BMI 23.91 kg/m   Physical Exam  Constitutional: She is oriented to person, place, and time. Vital signs are normal. She appears well-developed and well-nourished.  Non-toxic appearance. No distress.  Afebrile, nontoxic, NAD  HENT:  Head: Normocephalic and atraumatic.    Right Ear: Hearing, tympanic membrane, external ear and ear canal normal.  Left Ear: Hearing, tympanic membrane, external ear and ear canal normal.  Nose: Nose normal.  Mouth/Throat: Uvula is midline, oropharynx is clear and moist and mucous membranes are normal. No trismus in the jaw. No uvula swelling.  Ears are clear bilaterally, right ear with a ~1 cm sebaceous cyst located at the superior edge of the pinna attachment to the scalp, mildly fluctuant although no overlying erythema or warmth, no surrounding induration or cellulitis. Nose clear. Oropharynx clear and moist, without uvular swelling or deviation, no trismus or drooling.   Eyes: Conjunctivae and EOM are normal. Pupils are equal, round, and reactive to light. Right eye exhibits no discharge. Left eye exhibits no discharge.  PERRL, EOMI, no nystagmus, no visual field deficits     Neck: Normal range of motion. Neck supple.  Cardiovascular: Normal rate and intact distal pulses.   Pulmonary/Chest: Effort normal. No respiratory distress.  Abdominal: Normal appearance. She exhibits no distension.  Musculoskeletal: Normal range of motion.  Neurological: She is alert and oriented to person, place, and time. She has normal strength. No sensory deficit.  Skin: Skin is warm, dry and intact. No rash noted.  Right face/ear sebaceous cyst as mentioned above.   Psychiatric: She has a normal mood and affect. Her behavior is normal.  Nursing note and vitals reviewed.    ED Treatments / Results   DIAGNOSTIC STUDIES: Oxygen Saturation is 99% on RA, normal by my interpretation.    COORDINATION OF CARE: 12:50 PM Discussed treatment plan with pt and pt agreed to plan.    Labs (all labs ordered are listed, but only abnormal results are displayed) Labs Reviewed - No data to display  EKG  EKG  Interpretation None       Radiology No results found.  Procedures Procedures (including critical care time)  Medications Ordered in ED Medications - No data to display   Initial Impression / Assessment and Plan / ED Course  I have reviewed the triage vital signs and the nursing notes.  Pertinent labs & imaging results that were available during my care of the patient were reviewed by me and considered in my medical decision making (see chart for details).     31 y.o. female here with sebaceous cyst to R lateral head where the ear attaches to the scalp, increasing swelling and pain x3 days. On exam, mildly fluctuant but no overlying erythema/warmth, most c/w sebaceous cyst, could have possible secondary infection but discussed that cysts typically need to be removed in their entirety in order to avoid recurrence, and that I do not feel at this time I&D would be beneficial for this area; will start on abx and have her f/up with ENT for removal of the entire cyst and ongoing  management. No focal neuro deficits, doubt need for further work up. Tylenol/motrin/heat use advised. F/up with ENT in 5-7 days. I explained the diagnosis and have given explicit precautions to return to the ER including for any other new or worsening symptoms. The patient understands and accepts the medical plan as it's been dictated and I have answered their questions. Discharge instructions concerning home care and prescriptions have been given. The patient is STABLE and is discharged to home in good condition.   I personally performed the services described in this documentation, which was scribed in my presence. The recorded information has been reviewed and is accurate.   Final Clinical Impressions(s) / ED Diagnoses   Final diagnoses:  Sebaceous cyst of ear    New Prescriptions New Prescriptions   CLINDAMYCIN (CLEOCIN) 300 MG CAPSULE    Take 1 capsule (300 mg total) by mouth 3 (three) times daily. X 7 days     [address] PA-C 02/16/16 1307    Milton Ferguson, MD 02/16/16 2045

## 2016-02-16 NOTE — Discharge Instructions (Signed)
Keep area clean and dry. Apply warm compresses to affected area throughout the day. Take antibiotic until it is finished. Alternate between tylenol and motrin as needed for pain. Followup with the ENT doctor in 5-7 days for recheck and ongoing management of your sebaceous cyst. Monitor area for signs of infection to include, but not limited to: increasing pain, spreading redness, drainage/pus, worsening swelling, or fevers. Return to emergency department for emergent changing or worsening symptoms.

## 2016-03-30 ENCOUNTER — Ambulatory Visit: Payer: Self-pay | Admitting: Obstetrics & Gynecology

## 2018-06-06 ENCOUNTER — Encounter: Payer: Self-pay | Admitting: Obstetrics and Gynecology

## 2018-06-06 ENCOUNTER — Other Ambulatory Visit (HOSPITAL_COMMUNITY)
Admission: RE | Admit: 2018-06-06 | Discharge: 2018-06-06 | Disposition: A | Discharge status: Home / Self Care | Payer: Medicaid Other | Source: Ambulatory Visit | Attending: Obstetrics and Gynecology | Admitting: Obstetrics and Gynecology

## 2018-06-06 ENCOUNTER — Ambulatory Visit (INDEPENDENT_AMBULATORY_CARE_PROVIDER_SITE_OTHER): Payer: Medicaid Other | Admitting: Obstetrics and Gynecology

## 2018-06-06 ENCOUNTER — Other Ambulatory Visit: Payer: Self-pay

## 2018-06-06 VITALS — BP 90/50 | Wt 128.0 lb

## 2018-06-06 DIAGNOSIS — Z3A01 Less than 8 weeks gestation of pregnancy: Secondary | ICD-10-CM

## 2018-06-06 DIAGNOSIS — Z124 Encounter for screening for malignant neoplasm of cervix: Secondary | ICD-10-CM | POA: Diagnosis present

## 2018-06-06 DIAGNOSIS — Z3480 Encounter for supervision of other normal pregnancy, unspecified trimester: Secondary | ICD-10-CM | POA: Insufficient documentation

## 2018-06-06 DIAGNOSIS — F411 Generalized anxiety disorder: Secondary | ICD-10-CM

## 2018-06-06 DIAGNOSIS — R55 Syncope and collapse: Secondary | ICD-10-CM

## 2018-06-06 MED ORDER — FOLIC ACID 1 MG PO TABS: 4.0000 mg | ORAL_TABLET | Freq: Every day | ORAL | 11 refills | Status: DC

## 2018-06-06 MED ORDER — BUSPIRONE HCL 7.5 MG PO TABS: 7.5000 mg | ORAL_TABLET | Freq: Two times a day (BID) | ORAL | 3 refills | Status: DC

## 2018-06-06 NOTE — Progress Notes (Signed)
NOB C/o nausea & vomiting, slight cramping, fatigue  Didn't have flu shot this season

## 2018-06-06 NOTE — Progress Notes (Signed)
06/06/2018    Chief Complaint: Missed period  Transfer of Care Patient: no  History of Present Illness: Sarah Rivas is a 33 y.o. [id number] [redacted]w[redacted]d based on Patient's last menstrual period was 04/17/2018 (exact date). with an Estimated Date of Delivery: 01/22/19, with the above CC.   Her periods were: regular periods every 28 days She was using no method when she conceived.  She has Positive signs or symptoms of nausea/vomiting of pregnancy. She has Negative signs or symptoms of miscarriage or preterm labor She was taking different medications around the time she conceived/early pregnancy. Was taking Zyrtec. Since her LMP, she has not used alcohol Since her LMP, she has not used tobacco products Since her LMP, she has used illegal drugs. Marijuana early in pregnancy but stopped.  She claims she has gained   3 pounds since the start of her pregnancy.  Current or past history of domestic violence. no  Infection History:  1. Since her LMP, she has not had a viral illness.  2. She reports close contact with children on a regular basis  4 children at home  3. She has a history of chicken pox, or vaccination for chicken pox in the past. 4. Patient or partner has history of genital herpes  no 5. History of STI (GC, CT, HPV, syphilis, HIV)  no   6.  She does not live with someone with TB or TB exposed. 7. History of recent travel :  no 8. She identifies Negative Zika risk factors for her and her partner 17. There are cats in the home in the home:  No.  She understands that while pregnant she should not change cat litter.   Genetic Screening Questions: (Includes patient, baby's father, or anyone in either family)   1. Patient's age >/= 25 at [facility]  no 2. Thalassemia ([place], [place], [place], or Asian background): MCV<80  no 3. Neural tube defect (meningomyelocele, spina bifida, anencephaly)  Yes Son has a tethered spinal cord and a dimple 4. Congenital heart defect  no  5. Down syndrome   no 6. Tay-Sachs (Jewish, [place])  no 7. Canavan's Disease  no 8. Sickle cell disease or trait (African)  no  9. Hemophilia or other blood disorders  no  10. Muscular dystrophy  no  11. Cystic fibrosis  no  12. Huntington's Chorea  no  13. Mental retardation/autism  no 14. Other inherited genetic or chromosomal disorder  no 15. Maternal metabolic disorder (DM, PKU, etc)  no 16. Patient or FOB with a child with a birth defect not listed above no  16a. Patient or FOB with a birth defect themselves no 17. Recurrent pregnancy loss, or stillbirth  yes- hx of two miscarriages  18. Any medications since LMP other than prenatal vitamins (include vitamins, supplements, OTC meds, drugs, alcohol)  no 19. Any other genetic/environmental exposure to discuss  no  ROS:  Review of Systems  Constitutional: Negative for chills, fever, malaise/fatigue and weight loss.  HENT: Negative for congestion, hearing loss and sinus pain.   Eyes: Negative for blurred vision and double vision.  Respiratory: Negative for cough, sputum production, shortness of breath and wheezing.   Cardiovascular: Negative for chest pain, palpitations, orthopnea and leg swelling.  Gastrointestinal: Negative for abdominal pain, constipation, diarrhea, nausea and vomiting.  Genitourinary: Negative for dysuria, flank pain, frequency, hematuria and urgency.  Musculoskeletal: Negative for back pain, falls and joint pain.  Skin: Negative for itching and rash.  Neurological: Negative for dizziness and headaches.  Psychiatric/Behavioral: Negative for depression, substance abuse and suicidal ideas. The patient is not nervous/anxious.     OBGYN History: As per HPI. OB History  Gravida Para Term Preterm AB Living  5 3 3  0 1 3  SAB TAB Ectopic Multiple Live Births  1 0 0 0 3    # Outcome Date GA Lbr Len/2nd Weight Sex Delivery Anes PTL Lv  5 Current           4 Term 08/29/15 [redacted]w[redacted]d / 00:18 6 lb 8.8 oz (2.97 kg) M Vag-Spont EPI   LIV  3 Term 05/25/11        LIV  2 Term 08/17/09     Vag-Spont   LIV  1 SAB             Any issues with any prior pregnancies: yes, son with tethered spinal cord Any prior children are healthy, doing well, without any problems or issues: yes History of pap smears: Yes. Last pap smear 2017 result unknown History of STIs: No   Past Medical History: Past Medical History:  Diagnosis Date  . No pertinent past medical history     Past Surgical History: Past Surgical History:  Procedure Laterality Date  . DILATION AND CURETTAGE OF UTERUS    . TONSILLECTOMY      Family History:  Family History  Problem Relation Age of Onset  . Prostate cancer Maternal Grandfather 43  . Lung cancer Maternal Grandfather 19  . Anesthesia problems Neg Hx    She denies any female cancers, bleeding or blood clotting disorders.  She reports hx of tethered spinal cord with her son. Denies any additional history of mental retardation, birth defects or genetic disorders in her or the FOB's history  Social History:  Social History   Socioeconomic History  . Marital status: Married    Spouse name: Not on file  . Number of children: Not on file  . Years of education: Not on file  . Highest education level: Not on file  Occupational History  . Not on file  Social Needs  . Financial resource strain: Not on file  . Food insecurity:    Worry: Not on file    Inability: Not on file  . Transportation needs:    Medical: Not on file    Non-medical: Not on file  Tobacco Use  . Smoking status: Never Smoker  . Smokeless tobacco: Never Used  Substance and Sexual Activity  . Alcohol use: No  . Drug use: No  . Sexual activity: Yes    Birth control/protection: None  Lifestyle  . Physical activity:    Days per week: Not on file    Minutes per session: Not on file  . Stress: Not on file  Relationships  . Social connections:    Talks on phone: Not on file    Gets together: Not on file    Attends  religious service: Not on file    Active member of club or organization: Not on file    Attends meetings of clubs or organizations: Not on file    Relationship status: Not on file  . Intimate partner violence:    Fear of current or ex partner: Not on file    Emotionally abused: Not on file    Physically abused: Not on file    Forced sexual activity: Not on file  Other Topics Concern  . Not on file  Social History Narrative  . Not on file  Allergy: Allergies  Allergen Reactions  . Codeine     REACTION: hyper  . Sertraline Hcl     REACTION: felt in "[place]"    Current Outpatient Medications:  Current Outpatient Medications:  .  Prenatal Vit-Fe Fumarate-FA (PRENATAL VITAMIN PLUS LOW IRON) 27-1 MG TABS, Take 1 tablet by mouth daily., Disp: , Rfl:    Physical Exam: Physical Exam could not be performed. Because of the COVID-19 outbreak this visit was performed over the phone and not in person.    Assessment: Sarah Rivas is a 33 y.o. [id number] [redacted]w[redacted]d based on Patient's last menstrual period was 04/17/2018 (exact date). with an Estimated Date of Delivery: 01/22/19,  for prenatal care.  Plan:  1) Avoid alcoholic beverages. 2) Patient encouraged not to smoke.  3) Discontinue the use of all non-medicinal drugs and chemicals.  4) Take prenatal vitamins daily.  5) Seatbelt use advised 6) Nutrition, food safety (fish, cheese advisories, and high nitrite foods) and exercise discussed. 7) Hospital and practice style delivering at [facility] discussed  8) Patient is asked about travel to areas at risk for the [place] virus, and counseled to avoid travel and exposure to mosquitoes or sexual partners who may have themselves been exposed to the virus. Testing is discussed, and will be ordered as appropriate.  9) [facility]birth classes at [facility] advised 10) Genetic Screening, such as with 1st Trimester Screening, cell free fetal DNA, AFP testing, and Ultrasound, as well as with amniocentesis and CVS as  appropriate, is discussed with patient. She plans to decline genetic testing this pregnancy. 11) Advised to start 4 mg of folic acid daily for hx of son with tethered spinal cord. Advised for future pregnancies this should be started 3 months before conception and continued until at least 12 weeks 36) GAD- discussed therapy vs medication. Patient reluctant towards both. She is sleeping about 3-4 hours a night she says she does to bed at 8 but wakes up by 11 pm and then is up all night. She feels tired constantly and is dizzy. She feels like she can't sleep because her anxiety is so severe.     Problem list reviewed and updated.  I discussed the assessment and treatment plan with the patient. The patient was provided an opportunity to ask questions and all were answered. The patient agreed with the plan and demonstrated an understanding of the instructions.   The patient was advised to call back or seek an in-person evaluation if the symptoms worsen or if the condition fails to improve as anticipated.  I provided 30 minutes of face-to-face time during this encounter.  Adrian Prows MD [facility] 06/06/2018 10:09 AM

## 2018-06-08 LAB — URINE CULTURE

## 2018-06-08 LAB — CERVICOVAGINAL ANCILLARY ONLY
Chlamydia: NEGATIVE
Neisseria Gonorrhea: NEGATIVE

## 2018-06-09 LAB — MONITOR DRUG PROFILE 10(MW)
Amphetamine Scrn, Ur: NEGATIVE ng/mL
BARBITURATE SCREEN URINE: NEGATIVE ng/mL
BENZODIAZEPINE SCREEN, URINE: NEGATIVE ng/mL
Cocaine (Metab) Scrn, Ur: NEGATIVE ng/mL
Creatinine(Crt), U: 47.8 mg/dL (ref 20.0–300.0)
Methadone Screen, Urine: NEGATIVE ng/mL
OXYCODONE+OXYMORPHONE UR QL SCN: NEGATIVE ng/mL
Opiate Scrn, Ur: NEGATIVE ng/mL
Ph of Urine: 6.5 (ref 4.5–8.9)
Phencyclidine Qn, Ur: NEGATIVE ng/mL
Propoxyphene Scrn, Ur: NEGATIVE ng/mL

## 2018-06-09 LAB — CYTOLOGY - PAP
Diagnosis: NEGATIVE
HPV: NOT DETECTED

## 2018-06-09 LAB — CANNABINOID (GC/MS), URINE
Cannabinoid: POSITIVE — AB
Carboxy THC (GC/MS): 81 ng/mL

## 2018-06-10 ENCOUNTER — Ambulatory Visit (INDEPENDENT_AMBULATORY_CARE_PROVIDER_SITE_OTHER): Payer: Medicaid Other | Admitting: Advanced Practice Midwife

## 2018-06-10 ENCOUNTER — Other Ambulatory Visit: Payer: Self-pay | Admitting: Obstetrics and Gynecology

## 2018-06-10 ENCOUNTER — Other Ambulatory Visit: Payer: Self-pay

## 2018-06-10 ENCOUNTER — Ambulatory Visit (INDEPENDENT_AMBULATORY_CARE_PROVIDER_SITE_OTHER): Payer: Medicaid Other

## 2018-06-10 ENCOUNTER — Encounter: Payer: Self-pay | Admitting: Advanced Practice Midwife

## 2018-06-10 VITALS — BP 100/58 | Wt 126.0 lb

## 2018-06-10 DIAGNOSIS — Z3A08 8 weeks gestation of pregnancy: Secondary | ICD-10-CM | POA: Diagnosis not present

## 2018-06-10 DIAGNOSIS — O3481 Maternal care for other abnormalities of pelvic organs, first trimester: Secondary | ICD-10-CM | POA: Diagnosis not present

## 2018-06-10 DIAGNOSIS — Z3480 Encounter for supervision of other normal pregnancy, unspecified trimester: Secondary | ICD-10-CM

## 2018-06-10 DIAGNOSIS — Z3A01 Less than 8 weeks gestation of pregnancy: Secondary | ICD-10-CM

## 2018-06-10 DIAGNOSIS — Z3481 Encounter for supervision of other normal pregnancy, first trimester: Secondary | ICD-10-CM

## 2018-06-10 DIAGNOSIS — N8311 Corpus luteum cyst of right ovary: Secondary | ICD-10-CM | POA: Diagnosis not present

## 2018-06-10 NOTE — Patient Instructions (Signed)
First Trimester of Pregnancy  The first trimester of pregnancy is from week 1 until the end of week 13 (months 1 through 3). A week after a sperm fertilizes an egg, the egg will implant on the wall of the uterus. This embryo will begin to develop into a baby. Genes from you and your partner will form the baby. The female genes will determine whether the baby will be a boy or a girl. At 6-8 weeks, the eyes and face will be formed, and the heartbeat can be seen on ultrasound. At the end of 12 weeks, all the baby's organs will be formed.  Now that you are pregnant, you will want to do everything you can to have a healthy baby. Two of the most important things are to get good prenatal care and to follow your health care provider's instructions. Prenatal care is all the medical care you receive before the baby's birth. This care will help prevent, find, and treat any problems during the pregnancy and childbirth.  Body changes during your first trimester  Your body goes through many changes during pregnancy. The changes vary from woman to woman.   You may gain or lose a couple of pounds at first.   You may feel sick to your stomach (nauseous) and you may throw up (vomit). If the vomiting is uncontrollable, call your health care provider.   You may tire easily.   You may develop headaches that can be relieved by medicines. All medicines should be approved by your health care provider.   You may urinate more often. Painful urination may mean you have a bladder infection.   You may develop heartburn as a result of your pregnancy.   You may develop constipation because certain hormones are causing the muscles that push stool through your intestines to slow down.   You may develop hemorrhoids or swollen veins (varicose veins).   Your breasts may begin to grow larger and become tender. Your nipples may stick out more, and the tissue that surrounds them (areola) may become darker.   Your gums may bleed and may be  sensitive to brushing and flossing.   Dark spots or blotches (chloasma, mask of pregnancy) may develop on your face. This will likely fade after the baby is born.   Your menstrual periods will stop.   You may have a loss of appetite.   You may develop cravings for certain kinds of food.   You may have changes in your emotions from day to day, such as being excited to be pregnant or being concerned that something may go wrong with the pregnancy and baby.   You may have more vivid and strange dreams.   You may have changes in your hair. These can include thickening of your hair, rapid growth, and changes in texture. Some women also have hair loss during or after pregnancy, or hair that feels dry or thin. Your hair will most likely return to normal after your baby is born.  What to expect at prenatal visits  During a routine prenatal visit:   You will be weighed to make sure you and the baby are growing normally.   Your blood pressure will be taken.   Your abdomen will be measured to track your baby's growth.   The fetal heartbeat will be listened to between weeks 10 and 14 of your pregnancy.   Test results from any previous visits will be discussed.  Your health care provider may ask you:     How you are feeling.   If you are feeling the baby move.   If you have had any abnormal symptoms, such as leaking fluid, bleeding, severe headaches, or abdominal cramping.   If you are using any tobacco products, including cigarettes, chewing tobacco, and electronic cigarettes.   If you have any questions.  Other tests that may be performed during your first trimester include:   Blood tests to find your blood type and to check for the presence of any previous infections. The tests will also be used to check for low iron levels (anemia) and protein on red blood cells (Rh antibodies). Depending on your risk factors, or if you previously had diabetes during pregnancy, you may have tests to check for high blood sugar  that affects pregnant women (gestational diabetes).   Urine tests to check for infections, diabetes, or protein in the urine.   An ultrasound to confirm the proper growth and development of the baby.   Fetal screens for spinal cord problems (spina bifida) and Down syndrome.   HIV (human immunodeficiency virus) testing. Routine prenatal testing includes screening for HIV, unless you choose not to have this test.   You may need other tests to make sure you and the baby are doing well.  Follow these instructions at home:  Medicines   Follow your health care provider's instructions regarding medicine use. Specific medicines may be either safe or unsafe to take during pregnancy.   Take a prenatal vitamin that contains at least 600 micrograms (mcg) of folic acid.   If you develop constipation, try taking a stool softener if your health care provider approves.  Eating and drinking     Eat a balanced diet that includes fresh fruits and vegetables, whole grains, good sources of protein such as meat, eggs, or tofu, and low-fat dairy. Your health care provider will help you determine the amount of weight gain that is right for you.   Avoid raw meat and uncooked cheese. These carry germs that can cause birth defects in the baby.   Eating four or five small meals rather than three large meals a day may help relieve nausea and vomiting. If you start to feel nauseous, eating a few soda crackers can be helpful. Drinking liquids between meals, instead of during meals, also seems to help ease nausea and vomiting.   Limit foods that are high in fat and processed sugars, such as fried and sweet foods.   To prevent constipation:  ? Eat foods that are high in fiber, such as fresh fruits and vegetables, whole grains, and beans.  ? Drink enough fluid to keep your urine clear or pale yellow.  Activity   Exercise only as directed by your health care provider. Most women can continue their usual exercise routine during  pregnancy. Try to exercise for 30 minutes at least 5 days a week. Exercising will help you:  ? Control your weight.  ? Stay in shape.  ? Be prepared for labor and delivery.   Experiencing pain or cramping in the lower abdomen or lower back is a good sign that you should stop exercising. Check with your health care provider before continuing with normal exercises.   Try to avoid standing for long periods of time. Move your legs often if you must stand in one place for a long time.   Avoid heavy lifting.   Wear low-heeled shoes and practice good posture.   You may continue to have sex unless your health care   provider tells you not to.  Relieving pain and discomfort   Wear a good support bra to relieve breast tenderness.   Take warm sitz baths to soothe any pain or discomfort caused by hemorrhoids. Use hemorrhoid cream if your health care provider approves.   Rest with your legs elevated if you have leg cramps or low back pain.   If you develop varicose veins in your legs, wear support hose. Elevate your feet for 15 minutes, 3-4 times a day. Limit salt in your diet.  Prenatal care   Schedule your prenatal visits by the twelfth week of pregnancy. They are usually scheduled monthly at first, then more often in the last 2 months before delivery.   Write down your questions. Take them to your prenatal visits.   Keep all your prenatal visits as told by your health care provider. This is important.  Safety   Wear your seat belt at all times when driving.   Make a list of emergency phone numbers, including numbers for family, friends, the hospital, and police and fire departments.  General instructions   Ask your health care provider for a referral to a local prenatal education class. Begin classes no later than the beginning of month 6 of your pregnancy.   Ask for help if you have counseling or nutritional needs during pregnancy. Your health care provider can offer advice or refer you to specialists for help  with various needs.   Do not use hot tubs, steam rooms, or saunas.   Do not douche or use tampons or scented sanitary pads.   Do not cross your legs for long periods of time.   Avoid cat litter boxes and soil used by cats. These carry germs that can cause birth defects in the baby and possibly loss of the fetus by miscarriage or stillbirth.   Avoid all smoking, herbs, alcohol, and medicines not prescribed by your health care provider. Chemicals in these products affect the formation and growth of the baby.   Do not use any products that contain nicotine or tobacco, such as cigarettes and e-cigarettes. If you need help quitting, ask your health care provider. You may receive counseling support and other resources to help you quit.   Schedule a dentist appointment. At home, brush your teeth with a soft toothbrush and be gentle when you floss.  Contact a health care provider if:   You have dizziness.   You have mild pelvic cramps, pelvic pressure, or nagging pain in the abdominal area.   You have persistent nausea, vomiting, or diarrhea.   You have a bad smelling vaginal discharge.   You have pain when you urinate.   You notice increased swelling in your face, hands, legs, or ankles.   You are exposed to fifth disease or chickenpox.   You are exposed to German measles (rubella) and have never had it.  Get help right away if:   You have a fever.   You are leaking fluid from your vagina.   You have spotting or bleeding from your vagina.   You have severe abdominal cramping or pain.   You have rapid weight gain or loss.   You vomit blood or material that looks like coffee grounds.   You develop a severe headache.   You have shortness of breath.   You have any kind of trauma, such as from a fall or a car accident.  Summary   The first trimester of pregnancy is from week 1 until   the end of week 13 (months 1 through 3).   Your body goes through many changes during pregnancy. The changes vary from  woman to woman.   You will have routine prenatal visits. During those visits, your health care provider will examine you, discuss any test results you may have, and talk with you about how you are feeling.  This information is not intended to replace advice given to you by your health care provider. Make sure you discuss any questions you have with your health care provider.  Document Released: 12/16/2000 Document Revised: 12/04/2015 Document Reviewed: 12/04/2015  Elsevier Interactive Patient Education  2019 [employer]

## 2018-06-10 NOTE — Progress Notes (Signed)
Dating scan today. No complaints 

## 2018-06-10 NOTE — Progress Notes (Signed)
Released to mychart

## 2018-06-10 NOTE — Progress Notes (Signed)
  Routine Prenatal Care Visit  Subjective  Sarah Rivas is a 33 y.o. [phone] at [redacted]w[redacted]d being seen today for ongoing prenatal care.  She is currently monitored for the following issues for this low-risk pregnancy and has ANXIETY; ANXIETY, SITUATIONAL; DEPRESSION; COMMON MIGRAINE; HEARING LOSS; URI; FIBROCYSTIC BREAST DISEASE; DISORDER, MENSTRUAL NOS; SYNCOPE; CHEST PAIN; ABDOMINAL PAIN, RIGHT LOWER QUADRANT; and Supervision of other normal pregnancy, antepartum on their problem list.  ----------------------------------------------------------------------------------- Patient reports no complaints.  Discussed results of dating scan which agree with her last (shorter than usual) period in April.  . Vag. Bleeding: None.   . Denies leaking of fluid.  ----------------------------------------------------------------------------------- The following portions of the patient's history were reviewed and updated as appropriate: allergies, current medications, past family history, past medical history, past social history, past surgical history and problem list. Problem list updated.   Objective  Blood pressure (!) 100/58, weight 126 lb (57.2 kg), last menstrual period 04/17/2018. Pregravid weight 128 lb (58.1 kg) Total Weight Gain -2 lb (-0.907 kg) Urinalysis: Urine Protein    Urine Glucose    Fetal Status: Fetal Heart Rate (bpm): 160         Patient Name: Sarah Rivas DOB: August 14, 1985 MRN: [id number] ULTRASOUND REPORT  Location: [facility] Date of Service: 06/10/2018   Indications:Unsure LMP Findings:  Nelda [place] intrauterine pregnancy is visualized with a CRL consistent with [redacted]w[redacted]d gestation, giving an (U/S) EDD of 01/19/19. The (U/S) EDD is consistent with the clinically established EDD of 01/22/19.  FHR: 160 BPM CRL measurement: 16.8 mm Yolk sac is visualized and appears normal and early anatomy is normal. Amnion: visualized and appears normal   Right Ovary is normal in appearance.  Left Ovary is normal appearance. Corpus luteal cyst:  Right ovary Survey of the adnexa demonstrates no adnexal masses. There is no free peritoneal fluid in the cul de sac.  Impression: 1. [redacted]w[redacted]d Viable Singleton Intrauterine pregnancy by U/S. 2. (U/S) EDD is consistent with Clinically established EDD of 01/22/19.  Recommendations: 1.Clinical correlation with the patient's History and Physical Exam.  Vita Barley, RT  General:  Alert, oriented and cooperative. Patient is in no acute distress.  Skin: Skin is warm and dry. No rash noted.   Cardiovascular: Normal heart rate noted  Respiratory: Normal respiratory effort, no problems with respiration noted  Abdomen: Soft, gravid, appropriate for gestational age.       Pelvic:  Cervical exam deferred        Extremities: Normal range of motion.     Mental Status: Normal mood and affect. Normal behavior. Normal judgment and thought content.   Assessment   33 y.o. [id number] at [redacted]w[redacted]d by  01/22/2019, by Last Menstrual Period presenting for routine prenatal visit  Plan   Pregnancy #4 Problems (from 04/17/18 to present)    No problems associated with this episode.       Preterm labor symptoms and general obstetric precautions including but not limited to vaginal bleeding, contractions, leaking of fluid and fetal movement were reviewed in detail with the patient. Please refer to After Visit Summary for other counseling recommendations.   Return in about 6 weeks (around 07/22/2018) for [place].  Rod Can, CNM 06/10/2018 10:20 AM

## 2018-06-13 LAB — RPR+RH+ABO+RUB AB+AB SCR+CB...
Antibody Screen: NEGATIVE
HIV Screen 4th Generation wRfx: NONREACTIVE
Hematocrit: 40.8 % (ref 34.0–46.6)
Hemoglobin: 14 g/dL (ref 11.1–15.9)
Hepatitis B Surface Ag: NEGATIVE
MCH: 31.7 pg (ref 26.6–33.0)
MCHC: 34.3 g/dL (ref 31.5–35.7)
MCV: 92 fL (ref 79–97)
Platelets: 233 10*3/uL (ref 150–450)
RBC: 4.42 x10E6/uL (ref 3.77–5.28)
RDW: 12.4 % (ref 11.7–15.4)
RPR Ser Ql: NONREACTIVE
Rh Factor: POSITIVE
Rubella Antibodies, IGG: 1.83 index (ref >0.99)
Varicella zoster IgG: 531 index (ref >165)
WBC: 7.8 10*3/uL (ref 3.4–10.8)

## 2018-06-13 NOTE — Progress Notes (Signed)
Negative, Released to mychart 

## 2018-06-30 ENCOUNTER — Other Ambulatory Visit: Payer: Self-pay | Admitting: Obstetrics and Gynecology

## 2018-06-30 DIAGNOSIS — F411 Generalized anxiety disorder: Secondary | ICD-10-CM

## 2018-07-22 ENCOUNTER — Ambulatory Visit (INDEPENDENT_AMBULATORY_CARE_PROVIDER_SITE_OTHER): Payer: Medicaid Other | Admitting: Maternal Newborn

## 2018-07-22 ENCOUNTER — Encounter: Payer: Self-pay | Admitting: Maternal Newborn

## 2018-07-22 ENCOUNTER — Other Ambulatory Visit: Payer: Self-pay

## 2018-07-22 VITALS — BP 104/70 | Wt 127.0 lb

## 2018-07-22 DIAGNOSIS — Z3A13 13 weeks gestation of pregnancy: Secondary | ICD-10-CM

## 2018-07-22 DIAGNOSIS — Z3480 Encounter for supervision of other normal pregnancy, unspecified trimester: Secondary | ICD-10-CM

## 2018-07-22 DIAGNOSIS — Z3481 Encounter for supervision of other normal pregnancy, first trimester: Secondary | ICD-10-CM

## 2018-07-22 DIAGNOSIS — Z3689 Encounter for other specified antenatal screening: Secondary | ICD-10-CM

## 2018-07-22 LAB — POCT URINALYSIS DIPSTICK OB
Glucose, UA: NEGATIVE
POC,PROTEIN,UA: NEGATIVE

## 2018-07-22 NOTE — Progress Notes (Signed)
No vb. No lof.  

## 2018-07-22 NOTE — Patient Instructions (Signed)

## 2018-07-22 NOTE — Progress Notes (Addendum)
    Routine Prenatal Care Visit  Subjective  Sarah Rivas is a 33 y.o. [phone] at [redacted]w[redacted]d being seen today for ongoing prenatal care.  She is currently monitored for the following issues for this low-risk pregnancy and has ANXIETY; ANXIETY, SITUATIONAL; DEPRESSION; COMMON MIGRAINE; HEARING LOSS; URI; FIBROCYSTIC BREAST DISEASE; DISORDER, MENSTRUAL NOS; SYNCOPE; CHEST PAIN; ABDOMINAL PAIN, RIGHT LOWER QUADRANT; and Supervision of other normal pregnancy, antepartum on their problem list.  ----------------------------------------------------------------------------------- Patient reports nausea. This occurs daily; improving in the evening. Vag. Bleeding: None.   ----------------------------------------------------------------------------------- The following portions of the patient's history were reviewed and updated as appropriate: allergies, current medications, past family history, past medical history, past social history, past surgical history and problem list. Problem list updated.   Objective  Blood pressure 104/70, weight 127 lb (57.6 kg), last menstrual period 04/17/2018. Pregravid weight 128 lb (58.1 kg) Total Weight Gain -1 lb (-0.454 kg) Urinalysis: Urine dipstick shows negative for glucose, protein.  Fetal Status: Fetal Heart Rate (bpm): 150         General:  Alert, oriented and cooperative. Patient is in no acute distress.  Skin: Skin is warm and dry. No rash noted.   Cardiovascular: Normal heart rate noted  Respiratory: Normal respiratory effort, no problems with respiration noted  Abdomen: Soft, gravid, appropriate for gestational age. Pain/Pressure: Absent     Pelvic:  Cervical exam deferred        Extremities: Normal range of motion.     Mental Status: Normal mood and affect. Normal behavior. Normal judgment and thought content.     Assessment   33 y.o. [id number] at [redacted]w[redacted]d, EDD 01/22/2019 by Last Menstrual Period presenting for a routine prenatal visit.  Plan    Pregnancy #4 Problems (from 04/17/18 to present)    No problems associated with this episode.    Declines pharmacological agents to manage nausea for now.   Please refer to After Visit Summary for other counseling recommendations.   Return in about 4 weeks (around 08/19/2018) for ROB and anatomy scan.  Avel Sensor, CNM 07/22/2018  8:50 AM

## 2018-08-18 ENCOUNTER — Encounter: Payer: Medicaid Other | Admitting: Obstetrics and Gynecology

## 2018-08-18 ENCOUNTER — Other Ambulatory Visit: Payer: Medicaid Other

## 2018-08-19 ENCOUNTER — Encounter: Payer: Self-pay | Admitting: Maternal Newborn

## 2018-08-19 ENCOUNTER — Other Ambulatory Visit: Payer: Self-pay

## 2018-08-19 ENCOUNTER — Ambulatory Visit (INDEPENDENT_AMBULATORY_CARE_PROVIDER_SITE_OTHER): Payer: Medicaid Other | Admitting: Maternal Newborn

## 2018-08-19 ENCOUNTER — Ambulatory Visit (INDEPENDENT_AMBULATORY_CARE_PROVIDER_SITE_OTHER): Payer: Medicaid Other

## 2018-08-19 VITALS — BP 114/70 | Wt 127.0 lb

## 2018-08-19 DIAGNOSIS — Z3A17 17 weeks gestation of pregnancy: Secondary | ICD-10-CM

## 2018-08-19 DIAGNOSIS — Z363 Encounter for antenatal screening for malformations: Secondary | ICD-10-CM

## 2018-08-19 DIAGNOSIS — Z3482 Encounter for supervision of other normal pregnancy, second trimester: Secondary | ICD-10-CM

## 2018-08-19 DIAGNOSIS — Z3480 Encounter for supervision of other normal pregnancy, unspecified trimester: Secondary | ICD-10-CM

## 2018-08-19 DIAGNOSIS — Z3689 Encounter for other specified antenatal screening: Secondary | ICD-10-CM

## 2018-08-19 LAB — POCT URINALYSIS DIPSTICK OB
Glucose, UA: NEGATIVE
POC,PROTEIN,UA: NEGATIVE

## 2018-08-19 NOTE — Patient Instructions (Signed)

## 2018-08-19 NOTE — Progress Notes (Signed)
No vb. No lof. Anatomy scan today. 

## 2018-08-19 NOTE — Progress Notes (Signed)
    Routine Prenatal Care Visit  Subjective  Sarah Rivas is a 33 y.o. [phone] at [redacted]w[redacted]d being seen today for ongoing prenatal care.  She is currently monitored for the following issues for this low-risk pregnancy and has ANXIETY; ANXIETY, SITUATIONAL; DEPRESSION; COMMON MIGRAINE; HEARING LOSS; URI; FIBROCYSTIC BREAST DISEASE; DISORDER, MENSTRUAL NOS; SYNCOPE; CHEST PAIN; ABDOMINAL PAIN, RIGHT LOWER QUADRANT; and Supervision of other normal pregnancy, antepartum on their problem list.  ----------------------------------------------------------------------------------- Patient reports no complaints.   Vag. Bleeding: None. No leaking of fluid.  ----------------------------------------------------------------------------------- The following portions of the patient's history were reviewed and updated as appropriate: allergies, current medications, past family history, past medical history, past social history, past surgical history and problem list. Problem list updated.   Objective  Blood pressure 114/70, weight 127 lb (57.6 kg), last menstrual period 04/17/2018. Pregravid weight 128 lb (58.1 kg) Total Weight Gain -1 lb (-0.454 kg)  Urinalysis: Urine dipstick shows negative for glucose, protein.  Fetal Status: Fetal Heart Rate (bpm): 155 ([place])         General:  Alert, oriented and cooperative. Patient is in no acute distress.  Skin: Skin is warm and dry. No rash noted.   Cardiovascular: Normal heart rate noted  Respiratory: Normal respiratory effort, no problems with respiration noted  Abdomen: Soft, gravid, appropriate for gestational age. Pain/Pressure: Absent     Pelvic:  Cervical exam deferred        Extremities: Normal range of motion.     Mental Status: Normal mood and affect. Normal behavior. Normal judgment and thought content.     Assessment   33 y.o. [id number] at [redacted]w[redacted]d, EDD 01/22/2019 by Last Menstrual Period presenting for a routine prenatal visit.  Plan   Pregnancy #4  Problems (from 04/17/18 to present)    Problem Noted Resolved   Supervision of other normal pregnancy, antepartum 06/10/2018 by Rod Can, CNM No   Overview Addendum 07/22/2018  8:54 AM by Rexene Agent, [place] Prenatal Labs  Dating L=8 Blood type: A/Positive/-- (06/05 1020)   Genetic Screen Declines Antibody:Negative (06/05 1020)  Anatomic [place]  Rubella: 1.83 (06/05 1020) Varicella: Immune  GTT Early:               Third trimester:  RPR: Non Reactive (06/05 1020)   Rhogam  HBsAg: Negative (06/05 1020)   TDaP vaccine                       Flu Shot: HIV: Non Reactive (06/05 1020)   Baby Food                                GBS:   Contraception  Pap:  CBB     CS/VBAC    Support Person               Anatomy scan today is complete with normal female anatomy, FHR 191 bpm, cephalic position. Results reviewed with patient and partner.  Please refer to After Visit Summary for other counseling recommendations.   Return in about 4 weeks (around 09/16/2018) for [place] telephone.  Avel Sensor, CNM 08/19/2018  9:40 AM

## 2018-09-16 ENCOUNTER — Encounter: Payer: Self-pay | Admitting: Maternal Newborn

## 2018-09-16 ENCOUNTER — Other Ambulatory Visit: Payer: Self-pay

## 2018-09-16 ENCOUNTER — Ambulatory Visit (INDEPENDENT_AMBULATORY_CARE_PROVIDER_SITE_OTHER): Payer: Medicaid Other | Admitting: Maternal Newborn

## 2018-09-16 DIAGNOSIS — Z3480 Encounter for supervision of other normal pregnancy, unspecified trimester: Secondary | ICD-10-CM

## 2018-09-16 DIAGNOSIS — Z3482 Encounter for supervision of other normal pregnancy, second trimester: Secondary | ICD-10-CM

## 2018-09-16 DIAGNOSIS — Z3A21 21 weeks gestation of pregnancy: Secondary | ICD-10-CM

## 2018-09-16 NOTE — Progress Notes (Signed)
Virtual Visit via Telephone Note  I connected with Sarah Rivas on 09/16/18 at  9:18 AM EDT by telephone and verified that I am speaking with the correct person using two identifiers.  Location: Patient: Home Provider: Office  The patient agreed to proceed with a televisit today.  The note below documents the prenatal telephone visit:     Subjective  Sarah Rivas is a 33 y.o. [phone] at [redacted]w[redacted]d being seen today for ongoing prenatal care.  She is currently monitored for the following issues for this low-risk pregnancy and has ANXIETY; ANXIETY, SITUATIONAL; DEPRESSION; COMMON MIGRAINE; HEARING LOSS; URI; FIBROCYSTIC BREAST DISEASE; DISORDER, MENSTRUAL NOS; SYNCOPE; CHEST PAIN; ABDOMINAL PAIN, RIGHT LOWER QUADRANT; and Supervision of other normal pregnancy, antepartum on their problem list.  ----------------------------------------------------------------------------------- Patient reports heartburn; taking Tums to help. She does not have any other complaints. She is feeling daily fetal movement. Contractions: Not present. Vag. Bleeding: None.  Movement: Present. No leaking of fluid.  ----------------------------------------------------------------------------------- The following portions of the patient's history were reviewed and updated as appropriate: allergies, current medications, past family history, past medical history, past social history, past surgical history and problem list. Problem list updated.   Objective  Last menstrual period 04/17/2018. Pregravid weight 128 lb (58.1 kg) Total Weight Gain -1 lb (-0.454 kg)  Fetal Status:     Movement: Present     Physical exam could not be done as this was a telephone visit completed during COVID-19 precautions   Assessment   33 y.o. [id number] at [redacted]w[redacted]d, EDD 01/22/2019 by Last Menstrual Period presenting for a prenatal telephone visit.  Plan   Pregnancy #4 Problems (from 04/17/18 to present)    Problem Noted Resolved   Supervision of other normal pregnancy, antepartum 06/10/2018 by Rod Can, CNM No   Overview Addendum 08/19/2018  9:43 AM by Rexene Agent, [place] Prenatal Labs  Dating L=8 Blood type: A/Positive/-- (06/05 1020)   Genetic Screen Declines Antibody:Negative (06/05 1020)  Anatomic [place] Complete 08/19/2018 Rubella: 1.83 (06/05 1020) Varicella: Immune  GTT Early:               Third trimester:  RPR: Non Reactive (06/05 1020)   Rhogam N/A HBsAg: Negative (06/05 1020)   TDaP vaccine                       Flu Shot: HIV: Non Reactive (06/05 1020)   Baby Food                                GBS:   Contraception  Pap: 06/06/2018, NILM and HPV Negative  CBB     CS/VBAC    Support Person               Advised OTC Pepcid for heartburn if Tums is not relieving symptoms. Discussed normal expectations for fetal movement at this gestational age. GTT/labs next visit.   I discussed the assessment and treatment plan with the patient. The patient was provided an opportunity to ask questions and all were answered. The patient agreed with the plan and demonstrated an understanding of the instructions.   The patient was advised to call back or seek an in-person evaluation as needed.  I provided 5 minutes of non-face-to-face time during this encounter.  Please refer to After Visit Summary for other counseling recommendations.   Return in about 4 weeks (around 10/14/2018) for  ROB and GTT/labs.  Avel Sensor, CNM 09/16/2018  9:28 AM

## 2018-09-16 NOTE — Patient Instructions (Signed)

## 2018-09-16 NOTE — Progress Notes (Signed)
[place] telephone visit- pt wants to know if she should feel baby move a lot by now?

## 2018-10-14 ENCOUNTER — Ambulatory Visit (INDEPENDENT_AMBULATORY_CARE_PROVIDER_SITE_OTHER): Payer: Medicaid Other | Admitting: Obstetrics and Gynecology

## 2018-10-14 ENCOUNTER — Other Ambulatory Visit: Payer: Self-pay

## 2018-10-14 ENCOUNTER — Other Ambulatory Visit: Payer: Medicaid Other

## 2018-10-14 VITALS — BP 98/62 | Wt 142.0 lb

## 2018-10-14 DIAGNOSIS — Z3A25 25 weeks gestation of pregnancy: Secondary | ICD-10-CM

## 2018-10-14 DIAGNOSIS — Z3480 Encounter for supervision of other normal pregnancy, unspecified trimester: Secondary | ICD-10-CM

## 2018-10-14 DIAGNOSIS — Z3482 Encounter for supervision of other normal pregnancy, second trimester: Secondary | ICD-10-CM

## 2018-10-14 LAB — POCT URINALYSIS DIPSTICK OB
Glucose, UA: NEGATIVE
POC,PROTEIN,UA: NEGATIVE

## 2018-10-14 NOTE — Progress Notes (Signed)
ROB GTT today

## 2018-10-14 NOTE — Progress Notes (Signed)
    Routine Prenatal Care Visit  Subjective  Sarah Rivas is a 33 y.o. [phone] at [redacted]w[redacted]d being seen today for ongoing prenatal care.  She is currently monitored for the following issues for this low-risk pregnancy and has ANXIETY; ANXIETY, SITUATIONAL; DEPRESSION; COMMON MIGRAINE; HEARING LOSS; URI; FIBROCYSTIC BREAST DISEASE; DISORDER, MENSTRUAL NOS; SYNCOPE; CHEST PAIN; ABDOMINAL PAIN, RIGHT LOWER QUADRANT; and Supervision of other normal pregnancy, antepartum on their problem list.  ----------------------------------------------------------------------------------- Patient reports no complaints.   Contractions: Not present. Vag. Bleeding: None.  Movement: Present. Denies leaking of fluid.  ----------------------------------------------------------------------------------- The following portions of the patient's history were reviewed and updated as appropriate: allergies, current medications, past family history, past medical history, past social history, past surgical history and problem list. Problem list updated.   Objective  Blood pressure 98/62, weight 142 lb (64.4 kg), last menstrual period 04/17/2018. Pregravid weight 128 lb (58.1 kg) Total Weight Gain 14 lb (6.35 kg) Urinalysis:      Fetal Status: Fetal Heart Rate (bpm): 150 Fundal Height: 25 cm Movement: Present     General:  Alert, oriented and cooperative. Patient is in no acute distress.  Skin: Skin is warm and dry. No rash noted.   Cardiovascular: Normal heart rate noted  Respiratory: Normal respiratory effort, no problems with respiration noted  Abdomen: Soft, gravid, appropriate for gestational age. Pain/Pressure: Absent     Pelvic:  Cervical exam deferred        Extremities: Normal range of motion.     ental Status: Normal mood and affect. Normal behavior. Normal judgment and thought content.     Assessment   33 y.o. [id number] at [redacted]w[redacted]d by  01/22/2019, by Last Menstrual Period presenting for routine prenatal visit   Plan   Pregnancy #4 Problems (from 04/17/18 to present)    Problem Noted Resolved   Supervision of other normal pregnancy, antepartum 06/10/2018 by Rod Can, CNM No   Overview Addendum 08/19/2018  9:43 AM by Rexene Agent, [place] Prenatal Labs  Dating L=8 Blood type: A/Positive/-- (06/05 1020)   Genetic Screen Declines Antibody:Negative (06/05 1020)  Anatomic [place] Complete 08/19/2018 Rubella: 1.83 (06/05 1020) Varicella: Immune  GTT Early:               Third trimester:  RPR: Non Reactive (06/05 1020)   Rhogam N/A HBsAg: Negative (06/05 1020)   TDaP vaccine                       Flu Shot: HIV: Non Reactive (06/05 1020)   Baby Food                                GBS:   Contraception  Pap: 06/06/2018, NILM and HPV Negative  CBB     CS/VBAC    Support Person Carloyn Manner                 Gestational age appropriate obstetric precautions including but not limited to vaginal bleeding, contractions, leaking of fluid and fetal movement were reviewed in detail with the patient.    - GERD symptoms Rx omeprazole - 28 week labs today  Return in about 3 weeks (around 11/04/2018) for ROB.  [name], MD, [facility], [facility] 10/14/2018, 9:37 AM

## 2018-10-15 LAB — 28 WEEK RH+PANEL
Basophils Absolute: 0.1 10*3/uL (ref 0.0–0.2)
Basos: 1 %
EOS (ABSOLUTE): 0.2 10*3/uL (ref 0.0–0.4)
Eos: 2 %
Gestational Diabetes Screen: 132 mg/dL (ref 65–139)
HIV Screen 4th Generation wRfx: NONREACTIVE
Hematocrit: 35.4 % (ref 34.0–46.6)
Hemoglobin: 11.9 g/dL (ref 11.1–15.9)
Immature Grans (Abs): 0.1 10*3/uL (ref 0.0–0.1)
Immature Granulocytes: 1 %
Lymphocytes Absolute: 1.9 10*3/uL (ref 0.7–3.1)
Lymphs: 18 %
MCH: 32.2 pg (ref 26.6–33.0)
MCHC: 33.6 g/dL (ref 31.5–35.7)
MCV: 96 fL (ref 79–97)
Monocytes Absolute: 0.5 10*3/uL (ref 0.1–0.9)
Monocytes: 5 %
Neutrophils Absolute: 7.6 10*3/uL — ABNORMAL HIGH (ref 1.4–7.0)
Neutrophils: 73 %
Platelets: 264 10*3/uL (ref 150–450)
RBC: 3.69 x10E6/uL — ABNORMAL LOW (ref 3.77–5.28)
RDW: 12.5 % (ref 11.7–15.4)
RPR Ser Ql: NONREACTIVE
WBC: 10.3 10*3/uL (ref 3.4–10.8)

## 2018-11-04 ENCOUNTER — Other Ambulatory Visit: Payer: Self-pay

## 2018-11-04 ENCOUNTER — Encounter: Payer: Self-pay | Admitting: Obstetrics and Gynecology

## 2018-11-04 ENCOUNTER — Ambulatory Visit (INDEPENDENT_AMBULATORY_CARE_PROVIDER_SITE_OTHER): Payer: Medicaid Other | Admitting: Obstetrics and Gynecology

## 2018-11-04 VITALS — BP 100/50 | Wt 145.0 lb

## 2018-11-04 DIAGNOSIS — Z23 Encounter for immunization: Secondary | ICD-10-CM

## 2018-11-04 DIAGNOSIS — Z3A28 28 weeks gestation of pregnancy: Secondary | ICD-10-CM

## 2018-11-04 DIAGNOSIS — Z3483 Encounter for supervision of other normal pregnancy, third trimester: Secondary | ICD-10-CM

## 2018-11-04 DIAGNOSIS — Z3480 Encounter for supervision of other normal pregnancy, unspecified trimester: Secondary | ICD-10-CM

## 2018-11-04 NOTE — Patient Instructions (Signed)
Third Trimester of Pregnancy The third trimester is from week 28 through week 40 (months 7 through 9). The third trimester is a time when the unborn baby (fetus) is growing rapidly. At the end of the ninth month, the fetus is about 20 inches in length and weighs 6-10 pounds. Body changes during your third trimester Your body will continue to go through many changes during pregnancy. The changes vary from woman to woman. During the third trimester:  Your weight will continue to increase. You can expect to gain 25-35 pounds (11-16 kg) by the end of the pregnancy.  You may begin to get stretch marks on your hips, abdomen, and breasts.  You may urinate more often because the fetus is moving lower into your pelvis and pressing on your bladder.  You may develop or continue to have heartburn. This is caused by increased hormones that slow down muscles in the digestive tract.  You may develop or continue to have constipation because increased hormones slow digestion and cause the muscles that push waste through your intestines to relax.  You may develop hemorrhoids. These are swollen veins (varicose veins) in the rectum that can itch or be painful.  You may develop swollen, bulging veins (varicose veins) in your legs.  You may have increased body aches in the pelvis, back, or thighs. This is due to weight gain and increased hormones that are relaxing your joints.  You may have changes in your hair. These can include thickening of your hair, rapid growth, and changes in texture. Some women also have hair loss during or after pregnancy, or hair that feels dry or thin. Your hair will most likely return to normal after your baby is born.  Your breasts will continue to grow and they will continue to become tender. A yellow fluid (colostrum) may leak from your breasts. This is the first milk you are producing for your baby.  Your belly button may stick out.  You may notice more swelling in your hands,  face, or ankles.  You may have increased tingling or numbness in your hands, arms, and legs. The skin on your belly may also feel numb.  You may feel short of breath because of your expanding uterus.  You may have more problems sleeping. This can be caused by the size of your belly, increased need to urinate, and an increase in your body's metabolism.  You may notice the fetus "dropping," or moving lower in your abdomen (lightening).  You may have increased vaginal discharge.  You may notice your joints feel loose and you may have pain around your pelvic bone. What to expect at prenatal visits You will have prenatal exams every 2 weeks until week 36. Then you will have weekly prenatal exams. During a routine prenatal visit:  You will be weighed to make sure you and the baby are growing normally.  Your blood pressure will be taken.  Your abdomen will be measured to track your baby's growth.  The fetal heartbeat will be listened to.  Any test results from the previous visit will be discussed.  You may have a cervical check near your due date to see if your cervix has softened or thinned (effaced).  You will be tested for Group B streptococcus. This happens between 35 and 37 weeks. Your health care provider may ask you:  What your birth plan is.  How you are feeling.  If you are feeling the baby move.  If you have had any abnormal   symptoms, such as leaking fluid, bleeding, severe headaches, or abdominal cramping.  If you are using any tobacco products, including cigarettes, chewing tobacco, and electronic cigarettes.  If you have any questions. Other tests or screenings that may be performed during your third trimester include:  Blood tests that check for low iron levels (anemia).  Fetal testing to check the health, activity level, and growth of the fetus. Testing is done if you have certain medical conditions or if there are problems during the pregnancy.  Nonstress test  (NST). This test checks the health of your baby to make sure there are no signs of problems, such as the baby not getting enough oxygen. During this test, a belt is placed around your belly. The baby is made to move, and its heart rate is monitored during movement. What is false labor? False labor is a condition in which you feel small, irregular tightenings of the muscles in the womb (contractions) that usually go away with rest, changing position, or drinking water. These are called Braxton Hicks contractions. Contractions may last for hours, days, or even weeks before true labor sets in. If contractions come at regular intervals, become more frequent, increase in intensity, or become painful, you should see your health care provider. What are the signs of labor?  Abdominal cramps.  Regular contractions that start at 10 minutes apart and become stronger and more frequent with time.  Contractions that start on the top of the uterus and spread down to the lower abdomen and back.  Increased pelvic pressure and dull back pain.  A watery or bloody mucus discharge that comes from the vagina.  Leaking of amniotic fluid. This is also known as your "water breaking." It could be a slow trickle or a gush. Let your health care provider know if it has a color or strange odor. If you have any of these signs, call your health care provider right away, even if it is before your due date. Follow these instructions at home: Medicines  Follow your health care provider's instructions regarding medicine use. Specific medicines may be either safe or unsafe to take during pregnancy.  Take a prenatal vitamin that contains at least 600 micrograms (mcg) of folic acid.  If you develop constipation, try taking a stool softener if your health care provider approves. Eating and drinking   Eat a balanced diet that includes fresh fruits and vegetables, whole grains, good sources of protein such as meat, eggs, or tofu,  and low-fat dairy. Your health care provider will help you determine the amount of weight gain that is right for you.  Avoid raw meat and uncooked cheese. These carry germs that can cause birth defects in the baby.  If you have low calcium intake from food, talk to your health care provider about whether you should take a daily calcium supplement.  Eat four or five small meals rather than three large meals a day.  Limit foods that are high in fat and processed sugars, such as fried and sweet foods.  To prevent constipation: ? Drink enough fluid to keep your urine clear or pale yellow. ? Eat foods that are high in fiber, such as fresh fruits and vegetables, whole grains, and beans. Activity  Exercise only as directed by your health care provider. Most women can continue their usual exercise routine during pregnancy. Try to exercise for 30 minutes at least 5 days a week. Stop exercising if you experience uterine contractions.  Avoid heavy lifting.  Do   not exercise in extreme heat or humidity, or at high altitudes.  Wear low-heel, comfortable shoes.  Practice good posture.  You may continue to have sex unless your health care provider tells you otherwise. Relieving pain and discomfort  Take frequent breaks and rest with your legs elevated if you have leg cramps or low back pain.  Take warm sitz baths to soothe any pain or discomfort caused by hemorrhoids. Use hemorrhoid cream if your health care provider approves.  Wear a good support bra to prevent discomfort from breast tenderness.  If you develop varicose veins: ? Wear support pantyhose or compression stockings as told by your healthcare provider. ? Elevate your feet for 15 minutes, 3-4 times a day. Prenatal care  Write down your questions. Take them to your prenatal visits.  Keep all your prenatal visits as told by your health care provider. This is important. Safety  Wear your seat belt at all times when driving.  Make  a list of emergency phone numbers, including numbers for family, friends, the hospital, and police and fire departments. General instructions  Avoid cat litter boxes and soil used by cats. These carry germs that can cause birth defects in the baby. If you have a cat, ask someone to clean the litter box for you.  Do not travel far distances unless it is absolutely necessary and only with the approval of your health care provider.  Do not use hot tubs, steam rooms, or saunas.  Do not drink alcohol.  Do not use any products that contain nicotine or tobacco, such as cigarettes and e-cigarettes. If you need help quitting, ask your health care provider.  Do not use any medicinal herbs or unprescribed drugs. These chemicals affect the formation and growth of the baby.  Do not douche or use tampons or scented sanitary pads.  Do not cross your legs for long periods of time.  To prepare for the arrival of your baby: ? Take prenatal classes to understand, practice, and ask questions about labor and delivery. ? Make a trial run to the hospital. ? Visit the hospital and tour the maternity area. ? Arrange for maternity or paternity leave through employers. ? Arrange for family and friends to take care of pets while you are in the hospital. ? Purchase a rear-facing car seat and make sure you know how to install it in your car. ? Pack your hospital bag. ? Prepare the baby's nursery. Make sure to remove all pillows and stuffed animals from the baby's crib to prevent suffocation.  Visit your dentist if you have not gone during your pregnancy. Use a soft toothbrush to brush your teeth and be gentle when you floss. Contact a health care provider if:  You are unsure if you are in labor or if your water has broken.  You become dizzy.  You have mild pelvic cramps, pelvic pressure, or nagging pain in your abdominal area.  You have lower back pain.  You have persistent nausea, vomiting, or diarrhea.   You have an unusual or bad smelling vaginal discharge.  You have pain when you urinate. Get help right away if:  Your water breaks before 37 weeks.  You have regular contractions less than 5 minutes apart before 37 weeks.  You have a fever.  You are leaking fluid from your vagina.  You have spotting or bleeding from your vagina.  You have severe abdominal pain or cramping.  You have rapid weight loss or weight gain.  You have  shortness of breath with chest pain.  You notice sudden or extreme swelling of your face, hands, ankles, feet, or legs.  Your baby makes fewer than 10 movements in 2 hours.  You have severe headaches that do not go away when you take medicine.  You have vision changes. Summary  The third trimester is from week 28 through week 40, months 7 through 9. The third trimester is a time when the unborn baby (fetus) is growing rapidly.  During the third trimester, your discomfort may increase as you and your baby continue to gain weight. You may have abdominal, leg, and back pain, sleeping problems, and an increased need to urinate.  During the third trimester your breasts will keep growing and they will continue to become tender. A yellow fluid (colostrum) may leak from your breasts. This is the first milk you are producing for your baby.  False labor is a condition in which you feel small, irregular tightenings of the muscles in the womb (contractions) that eventually go away. These are called Braxton Hicks contractions. Contractions may last for hours, days, or even weeks before true labor sets in.  Signs of labor can include: abdominal cramps; regular contractions that start at 10 minutes apart and become stronger and more frequent with time; watery or bloody mucus discharge that comes from the vagina; increased pelvic pressure and dull back pain; and leaking of amniotic fluid. This information is not intended to replace advice given to you by your health  care provider. Make sure you discuss any questions you have with your health care provider. Document Released: 12/16/2000 Document Revised: 04/14/2018 Document Reviewed: 01/28/2016 Elsevier Patient Education  2020 [employer]  

## 2018-11-04 NOTE — Progress Notes (Signed)
ROB C/o anxiety and heart palpitations mostly at night, slight spotting wed Denies lof, no vb Good FM

## 2018-11-04 NOTE — Progress Notes (Signed)
    Routine Prenatal Care Visit  Subjective  Sarah Rivas is a 33 y.o. [phone] at [redacted]w[redacted]d being seen today for ongoing prenatal care.  She is currently monitored for the following issues for this low-risk pregnancy and has ANXIETY; ANXIETY, SITUATIONAL; DEPRESSION; COMMON MIGRAINE; HEARING LOSS; URI; FIBROCYSTIC BREAST DISEASE; DISORDER, MENSTRUAL NOS; SYNCOPE; CHEST PAIN; ABDOMINAL PAIN, RIGHT LOWER QUADRANT; and Supervision of other normal pregnancy, antepartum on their problem list.  ----------------------------------------------------------------------------------- Patient reports difficulty sleeping at night, declines counseling will continue with buspar usage.   Contractions: Not present. Vag. Bleeding: None.  Movement: Present. Denies leaking of fluid.  ----------------------------------------------------------------------------------- The following portions of the patient's history were reviewed and updated as appropriate: allergies, current medications, past family history, past medical history, past social history, past surgical history and problem list. Problem list updated.   Objective  Blood pressure (!) 100/50, weight 145 lb (65.8 kg), last menstrual period 04/17/2018. Pregravid weight 128 lb (58.1 kg) Total Weight Gain 17 lb (7.711 kg) Urinalysis:      Fetal Status: Fetal Heart Rate (bpm): 135 Fundal Height: 28 cm Movement: Present     General:  Alert, oriented and cooperative. Patient is in no acute distress.  Skin: Skin is warm and dry. No rash noted.   Cardiovascular: Normal heart rate noted  Respiratory: Normal respiratory effort, no problems with respiration noted  Abdomen: Soft, gravid, appropriate for gestational age. Pain/Pressure: Absent     Pelvic:  Cervical exam deferred        Extremities: Normal range of motion.  Edema: None  Mental Status: Normal mood and affect. Normal behavior. Normal judgment and thought content.     Assessment   33 y.o. [id number] at  [redacted]w[redacted]d by  01/22/2019, by Last Menstrual Period presenting for routine prenatal visit  Plan   Pregnancy #4 Problems (from 04/17/18 to present)    Problem Noted Resolved   Supervision of other normal pregnancy, antepartum 06/10/2018 by Rod Can, CNM No   Overview Addendum 11/04/2018 10:00 AM by Homero Fellers, MD    [facility]inic [facility] Prenatal Labs  Dating L=8 Blood type: A/Positive/-- (06/05 1020)   Genetic Screen Declines Antibody:Negative (06/05 1020)  Anatomic [place] Complete 08/19/2018 Rubella: 1.83 (06/05 1020) Varicella: Immune  GTT   Third trimester: 132 RPR: Non Reactive (06/05 1020)   Rhogam N/A HBsAg: Negative (06/05 1020)   TDaP vaccine 11/04/2018                       Flu Shot:DECLINES HIV: Non Reactive (06/05 1020)   Baby Food                                GBS:   Contraception Tubal ligation- consent signed 11/04/2018 Pap: 06/06/2018, NILM and HPV Negative  CBB     CS/VBAC    Support Person Carloyn Manner                 Gestational age appropriate obstetric precautions including but not limited to vaginal bleeding, contractions, leaking of fluid and fetal movement were reviewed in detail with the patient.    Discussed postpartum tubal ligation Tdap today Declines flu shot Tubal consent form today.   Return in about 2 weeks (around 11/18/2018) for ROB in person.  Homero Fellers MD [facility], [facility] 11/04/2018, 10:00 AM

## 2018-11-18 ENCOUNTER — Ambulatory Visit (INDEPENDENT_AMBULATORY_CARE_PROVIDER_SITE_OTHER): Payer: Medicaid Other | Admitting: Obstetrics & Gynecology

## 2018-11-18 ENCOUNTER — Encounter: Payer: Self-pay | Admitting: Obstetrics & Gynecology

## 2018-11-18 ENCOUNTER — Other Ambulatory Visit: Payer: Self-pay

## 2018-11-18 VITALS — BP 100/60 | Wt 147.0 lb

## 2018-11-18 DIAGNOSIS — F1290 Cannabis use, unspecified, uncomplicated: Secondary | ICD-10-CM

## 2018-11-18 DIAGNOSIS — O99323 Drug use complicating pregnancy, third trimester: Secondary | ICD-10-CM

## 2018-11-18 DIAGNOSIS — Z3A30 30 weeks gestation of pregnancy: Secondary | ICD-10-CM

## 2018-11-18 DIAGNOSIS — Z3480 Encounter for supervision of other normal pregnancy, unspecified trimester: Secondary | ICD-10-CM

## 2018-11-18 NOTE — Progress Notes (Signed)
  Subjective  Fetal Movement? yes Contractions? no Leaking Fluid? no Vaginal Bleeding? no  Objective  BP 100/60   Wt 147 lb (66.7 kg)   LMP 04/17/2018 (Exact Date)   BMI 26.04 kg/m  General: NAD Pumonary: no increased work of breathing Abdomen: gravid, non-tender Extremities: no edema Psychiatric: mood appropriate, affect full  Assessment  33 y.o. [id number] at [redacted]w[redacted]d by  01/22/2019, by Last Menstrual Period presenting for routine prenatal visit  Plan   Problem List Items Addressed This Visit      Other   Supervision of other normal pregnancy, antepartum    Other Visit Diagnoses    [redacted] weeks gestation of pregnancy    -  Primary   Marijuana user       Relevant Orders   Drug Screen, Urine      Pregnancy #4 Problems (from 04/17/18 to present)    Problem Noted Resolved   Supervision of other normal pregnancy, antepartum 06/10/2018 by Rod Can, CNM No   Overview Addendum 11/04/2018 10:00 AM by Homero Fellers, MD    [facility] Prenatal Labs  Dating L=8 Blood type: A/Positive/-- (06/05 1020)   Genetic Screen Declines Antibody:Negative (06/05 1020)  Anatomic [place] Complete 08/19/2018 Rubella: 1.83 (06/05 1020) Varicella: Immune  GTT   Third trimester: 132 RPR: Non Reactive (06/05 1020)   Rhogam N/A HBsAg: Negative (06/05 1020)   TDaP vaccine 11/04/2018                       Flu Shot:DECLINES HIV: Non Reactive (06/05 1020)   Baby Food                                GBS:   Contraception Tubal ligation- consent signed 11/04/2018 Pap: 06/06/2018, NILM and HPV Negative  CBB     CS/VBAC    Support Person Roy               PN[facility], [facility]  Labor precautions  Pt prefers IOL 39 weeks if possible and appropriate (prior pregnancy IOL 39 weeks without complication)  Barnett Applebaum, MD, [facility], [facility] 11/18/2018  10:14 AM

## 2018-11-18 NOTE — Patient Instructions (Addendum)
Heartburn During Pregnancy  Heartburn is a type of pain or discomfort that can happen in the throat or chest. It is often described as a burning sensation. Heartburn is common during pregnancy because:  · A hormone (progesterone) that is released during pregnancy may relax the valve (lower esophageal sphincter, or LES) that separates the esophagus from the stomach. This allows stomach acid to move up into the esophagus, causing heartburn.  · The uterus gets larger and pushes up on the stomach, which pushes more acid into the esophagus. This is especially true in the later stages of pregnancy.  Heartburn usually goes away or gets better after giving birth.  What are the causes?  Heartburn is caused by stomach acid backing up into the esophagus (reflux). Reflux can be triggered by:  · Changing hormone levels.  · Large meals.  · Certain foods and beverages, such as coffee, chocolate, onions, and peppermint.  · Exercise.  · Increased stomach acid production.  What increases the risk?  You are more likely to experience heartburn during pregnancy if you:  · Had heartburn prior to becoming pregnant.  · Have been pregnant more than once before.  · Are overweight or obese.  The likelihood that you will get heartburn also increases as you get farther along in your pregnancy, especially during the last trimester.  What are the signs or symptoms?  Symptoms of this condition include:  · Burning pain in the chest or lower throat.  · Bitter taste in the mouth.  · Coughing.  · Problems swallowing.  · Vomiting.  · Hoarse voice.  · Asthma.  Symptoms may get worse when you lie down or bend over. Symptoms are often worse at night.  How is this diagnosed?  This condition is diagnosed based on:  · Your medical history.  · Your symptoms.  · Blood tests to check for a certain type of bacteria associated with heartburn.  · Whether taking heartburn medicine relieves your symptoms.  · Examination of the stomach and esophagus using a tube with  a light and camera on the end (endoscopy).  How is this treated?  Treatment varies depending on how severe your symptoms are. Your health care provider may recommend:  · Over-the-counter medicines (antacids or acid reducers) for mild heartburn.  · Prescription medicines to decrease stomach acid or to protect your stomach lining.  · Certain changes in your diet.  · Raising the head of your bed so it is higher than the foot of the bed. This helps prevent stomach acid from backing up into the esophagus when you are lying down.  Follow these instructions at home:  Eating and drinking  · Do not drink alcohol during your pregnancy.  · Identify foods and beverages that make your symptoms worse, and avoid them.  · Beverages that you may want to avoid include:  ? Coffee and tea (with or without caffeine).  ? Energy drinks and sports drinks.  ? Carbonated drinks or sodas.  ? Citrus fruit juices.  · Foods that you may want to avoid include:  ? Chocolate and cocoa.  ? Peppermint and mint flavorings.  ? Garlic, onions, and horseradish.  ? Spicy and acidic foods, including peppers, chili powder, curry powder, vinegar, hot sauces, and barbecue sauce.  ? Citrus fruits, such as oranges, lemons, and limes.  ? Tomato-based foods, such as red sauce, chili, and salsa.  ? Fried and fatty foods, such as donuts, french fries, potato chips, and high-fat dressings.  ?   after you eat. °· Do not exercise right after you eat. °Medicines °· Take over-the-counter and prescription medicines only as told by your health care provider. °· Do not take aspirin, ibuprofen, or other NSAIDs unless your health care provider tells  you to do that. °· You may be instructed to avoid medicines that contain sodium bicarbonate. °General instructions ° °· If directed, raise the head of your bed about 6 inches (15 cm) by putting blocks under the legs. Sleeping with more pillows does not effectively relieve heartburn because it only changes the position of your head. °· Do not use any products that contain nicotine or tobacco, such as cigarettes and e-cigarettes. If you need help quitting, ask your health care provider. °· Wear loose-fitting clothing. °· Try to reduce your stress, such as with yoga or meditation. If you need help managing stress, ask your health care provider. °· Maintain a healthy weight. If you are overweight, work with your health care provider to safely lose weight. °· Keep all follow-up visits as told by your health care provider. This is important. °Contact a health care provider if: °· You develop new symptoms. °· Your symptoms do not improve with treatment. °· You have unexplained weight loss. °· You have difficulty swallowing. °· You make loud sounds when you breathe (wheeze). °· You have a cough that does not go away. °· You have frequent heartburn for more than 2 weeks. °· You have nausea or vomiting that does not get better with treatment. °· You have pain in your abdomen. °Get help right away if: °· You have severe chest pain that spreads to your arm, neck, or jaw. °· You feel sweaty, dizzy, or light-headed. °· You have shortness of breath. °· You have pain when swallowing. °· You vomit, and your vomit looks like blood or coffee grounds. °· Your stool is bloody or black. °This information is not intended to replace advice given to you by your health care provider. Make sure you discuss any questions you have with your health care provider. °Document Released: 12/20/1999 Document Revised: 03/22/2017 Document Reviewed: 09/09/2015 °Elsevier Patient Education © 2020 [employer] Inc[employer] ° °

## 2018-11-19 LAB — DRUG SCREEN, URINE
Amphetamines, Urine: NEGATIVE ng/mL
Barbiturate screen, urine: NEGATIVE ng/mL
Benzodiazepine Quant, Ur: NEGATIVE ng/mL
Cannabinoid Quant, Ur: NEGATIVE ng/mL
Cocaine (Metab.): NEGATIVE ng/mL
Opiate Quant, Ur: NEGATIVE ng/mL
PCP Quant, Ur: NEGATIVE ng/mL

## 2018-12-06 ENCOUNTER — Encounter: Payer: Self-pay | Admitting: Obstetrics & Gynecology

## 2018-12-06 ENCOUNTER — Ambulatory Visit (INDEPENDENT_AMBULATORY_CARE_PROVIDER_SITE_OTHER): Payer: Medicaid Other | Admitting: Obstetrics & Gynecology

## 2018-12-06 ENCOUNTER — Other Ambulatory Visit: Payer: Self-pay

## 2018-12-06 VITALS — BP 98/54 | Wt 149.0 lb

## 2018-12-06 DIAGNOSIS — Z3A33 33 weeks gestation of pregnancy: Secondary | ICD-10-CM

## 2018-12-06 DIAGNOSIS — Z3483 Encounter for supervision of other normal pregnancy, third trimester: Secondary | ICD-10-CM

## 2018-12-06 DIAGNOSIS — Z3480 Encounter for supervision of other normal pregnancy, unspecified trimester: Secondary | ICD-10-CM

## 2018-12-06 LAB — POCT URINALYSIS DIPSTICK OB
Glucose, UA: NEGATIVE
POC,PROTEIN,UA: NEGATIVE

## 2018-12-06 NOTE — Progress Notes (Signed)
  Subjective  Fetal Movement? yes Contractions? no Leaking Fluid? no Vaginal Bleeding? no  Objective  BP (!) 98/54   Wt 149 lb (67.6 kg)   LMP 04/17/2018 (Exact Date)   BMI 26.39 kg/m  General: NAD Pumonary: no increased work of breathing Abdomen: gravid, non-tender Extremities: no edema Psychiatric: mood appropriate, affect full  Assessment  33 y.o. [id number] at [redacted]w[redacted]d by  01/22/2019, by Last Menstrual Period presenting for routine prenatal visit  Plan   Problem List Items Addressed This Visit      Other   Supervision of other normal pregnancy, antepartum - Primary   Relevant Orders   POC Urinalysis Dipstick OB (Completed)      Pregnancy #4 Problems (from 04/17/18 to present)    Problem Noted Resolved   Supervision of other normal pregnancy, antepartum 06/10/2018 by Rod Can, CNM No   Overview Addendum 11/04/2018 10:00 AM by Homero Fellers, MD    Clinic [facility] Prenatal Labs  Dating L=8 Blood type: A/Positive/-- (06/05 1020)   Genetic Screen Declines Antibody:Negative (06/05 1020)  Anatomic [place] Complete 08/19/2018 Rubella: 1.83 (06/05 1020) Varicella: Immune  GTT   Third trimester: 132 RPR: Non Reactive (06/05 1020)   Rhogam N/A HBsAg: Negative (06/05 1020)   TDaP vaccine 11/04/2018                       Flu Shot:DECLINES HIV: Non Reactive (06/05 1020)   Baby Food                      unsure          GBS: p  Contraception Tubal ligation- consent signed 11/04/2018 Pap: 06/06/2018, NILM and HPV Negative  CBB  no   CS/VBAC n/a   Support Person Carloyn Manner               PNV, [facility]  PTL precautions  Barnett Applebaum, MD, [facility], [facility] 12/06/2018  3:20 PM

## 2018-12-06 NOTE — Patient Instructions (Signed)
Braxton Hicks Contractions Contractions of the uterus can occur throughout pregnancy, but they are not always a sign that you are in labor. You may have practice contractions called Braxton Hicks contractions. These false labor contractions are sometimes confused with true labor. What are Braxton Hicks contractions? Braxton Hicks contractions are tightening movements that occur in the muscles of the uterus before labor. Unlike true labor contractions, these contractions do not result in opening (dilation) and thinning of the cervix. Toward the end of pregnancy (32-34 weeks), Braxton Hicks contractions can happen more often and may become stronger. These contractions are sometimes difficult to tell apart from true labor because they can be very uncomfortable. You should not feel embarrassed if you go to the hospital with false labor. Sometimes, the only way to tell if you are in true labor is for your health care provider to look for changes in the cervix. The health care provider will do a physical exam and may monitor your contractions. If you are not in true labor, the exam should show that your cervix is not dilating and your water has not broken. If there are no other health problems associated with your pregnancy, it is completely safe for you to be sent home with false labor. You may continue to have Braxton Hicks contractions until you go into true labor. How to tell the difference between true labor and false labor True labor  Contractions last 30-70 seconds.  Contractions become very regular.  Discomfort is usually felt in the top of the uterus, and it spreads to the lower abdomen and low back.  Contractions do not go away with walking.  Contractions usually become more intense and increase in frequency.  The cervix dilates and gets thinner. False labor  Contractions are usually shorter and not as strong as true labor contractions.  Contractions are usually irregular.  Contractions  are often felt in the front of the lower abdomen and in the groin.  Contractions may go away when you walk around or change positions while lying down.  Contractions get weaker and are shorter-lasting as time goes on.  The cervix usually does not dilate or become thin. Follow these instructions at home:   Take over-the-counter and prescription medicines only as told by your health care provider.  Keep up with your usual exercises and follow other instructions from your health care provider.  Eat and drink lightly if you think you are going into labor.  If Braxton Hicks contractions are making you uncomfortable: ? Change your position from lying down or resting to walking, or change from walking to resting. ? Sit and rest in a tub of warm water. ? Drink enough fluid to keep your urine pale yellow. Dehydration may cause these contractions. ? Do slow and deep breathing several times an hour.  Keep all follow-up prenatal visits as told by your health care provider. This is important. Contact a health care provider if:  You have a fever.  You have continuous pain in your abdomen. Get help right away if:  Your contractions become stronger, more regular, and closer together.  You have fluid leaking or gushing from your vagina.  You pass blood-tinged mucus (bloody show).  You have bleeding from your vagina.  You have low back pain that you never had before.  You feel your baby's head pushing down and causing pelvic pressure.  Your baby is not moving inside you as much as it used to. Summary  Contractions that occur before labor are   called Braxton Hicks contractions, false labor, or practice contractions.  Braxton Hicks contractions are usually shorter, weaker, farther apart, and less regular than true labor contractions. True labor contractions usually become progressively stronger and regular, and they become more frequent.  Manage discomfort from [facility] contractions  by changing position, resting in a warm bath, drinking plenty of water, or practicing deep breathing. This information is not intended to replace advice given to you by your health care provider. Make sure you discuss any questions you have with your health care provider. Document Released: 05/07/2016 Document Revised: 12/04/2016 Document Reviewed: 05/07/2016 Elsevier Patient Education  2020 [employer].

## 2018-12-21 ENCOUNTER — Telehealth: Payer: Self-pay

## 2018-12-21 NOTE — Telephone Encounter (Signed)
Pt called triage stating she has been having a cough for a little while now and it has gotten to the point where she would like to take something for the cough. She wanted to know the OTC's she could take while pregnant. I have advised pt she can take robitussin and mucinex. Pt has an appt on Friday and I have advised her to tell Opal Sidles if she is not getting any better. Pt has been tested for Covid and she is Negative.

## 2018-12-23 ENCOUNTER — Ambulatory Visit (INDEPENDENT_AMBULATORY_CARE_PROVIDER_SITE_OTHER): Payer: Medicaid Other | Admitting: Advanced Practice Midwife

## 2018-12-23 ENCOUNTER — Other Ambulatory Visit: Payer: Self-pay

## 2018-12-23 ENCOUNTER — Encounter: Payer: Self-pay | Admitting: Advanced Practice Midwife

## 2018-12-23 VITALS — BP 120/80 | Wt 151.0 lb

## 2018-12-23 DIAGNOSIS — Z3A35 35 weeks gestation of pregnancy: Secondary | ICD-10-CM

## 2018-12-23 DIAGNOSIS — Z3483 Encounter for supervision of other normal pregnancy, third trimester: Secondary | ICD-10-CM

## 2018-12-23 NOTE — Progress Notes (Signed)
Routine Prenatal Care Visit  Subjective  Sarah Rivas is a 33 y.o. [phone] at [redacted]w[redacted]d being seen today for ongoing prenatal care.  She is currently monitored for the following issues for this low-risk pregnancy and has ANXIETY; ANXIETY, SITUATIONAL; DEPRESSION; COMMON MIGRAINE; HEARING LOSS; URI; FIBROCYSTIC BREAST DISEASE; DISORDER, MENSTRUAL NOS; SYNCOPE; CHEST PAIN; ABDOMINAL PAIN, RIGHT LOWER QUADRANT; and Supervision of other normal pregnancy, antepartum on their problem list.  ----------------------------------------------------------------------------------- Patient reports non-productive cough times 2 weeks. She tested negative for covid/flu on last Sunday. She denies fever, chills, body aches. She has some shortness of breath and some vomiting that is related to coughing. She denies congestion or being around anyone who has been sick.  She has tried robitussin with minimal relief. She throws up after taking it.   Contractions: Irregular. Vag. Bleeding: None.  Movement: Present. Leaking Fluid denies.  ----------------------------------------------------------------------------------- The following portions of the patient's history were reviewed and updated as appropriate: allergies, current medications, past family history, past medical history, past social history, past surgical history and problem list. Problem list updated.  Objective  Blood pressure 120/80, weight 151 lb (68.5 kg), last menstrual period 04/17/2018. Pregravid weight 128 lb (58.1 kg) Total Weight Gain 23 lb (10.4 kg) Urinalysis: Urine Protein    Urine Glucose    Fetal Status: Fetal Heart Rate (bpm): 148 Fundal Height: 36 cm Movement: Present     General:  Alert, oriented and cooperative. Patient is in mild distress.  Skin: Skin is warm and dry. No rash noted.   Cardiovascular: Normal heart rate noted  Respiratory: Normal respiratory effort, no problems with respiration noted  Abdomen: Soft, gravid, appropriate  for gestational age. Pain/Pressure: Present     Pelvic:  Cervical exam deferred        Extremities: Normal range of motion.  Edema: None  Mental Status: Normal mood and affect. Normal behavior. Normal judgment and thought content.   Assessment   33 y.o. [id number] at [redacted]w[redacted]d by  01/22/2019, by Last Menstrual Period presenting for routine prenatal visit  Plan   Pregnancy #4 Problems (from 04/17/18 to present)    Problem Noted Resolved   Supervision of other normal pregnancy, antepartum 06/10/2018 by Rod Can, CNM No   Overview Addendum 11/04/2018 10:00 AM by Homero Fellers, MD    [facility] Prenatal Labs  Dating L=8 Blood type: A/Positive/-- (06/05 1020)   Genetic Screen Declines Antibody:Negative (06/05 1020)  Anatomic [place] Complete 08/19/2018 Rubella: 1.83 (06/05 1020) Varicella: Immune  GTT   Third trimester: 132 RPR: Non Reactive (06/05 1020)   Rhogam N/A HBsAg: Negative (06/05 1020)   TDaP vaccine 11/04/2018                       Flu Shot:DECLINES HIV: Non Reactive (06/05 1020)   Baby Food                                GBS:   Contraception Tubal ligation- consent signed 11/04/2018 Pap: 06/06/2018, NILM and HPV Negative  CBB     CS/VBAC    Support Person Carloyn Manner                 Preterm labor symptoms and general obstetric precautions including but not limited to vaginal bleeding, contractions, leaking of fluid and fetal movement were reviewed in detail with the patient. Cough: hydrate, humidifier, vitamin C or Emergen C, zinc lozenges, benadryl as needed  for sleep, robitussin  Return in about 1 week (around 12/30/2018) for rob.  Rod Can, CNM 12/23/2018 11:55 AM

## 2019-01-02 ENCOUNTER — Ambulatory Visit (INDEPENDENT_AMBULATORY_CARE_PROVIDER_SITE_OTHER): Payer: Medicaid Other | Admitting: Obstetrics and Gynecology

## 2019-01-02 ENCOUNTER — Other Ambulatory Visit: Payer: Self-pay

## 2019-01-02 VITALS — BP 98/56 | Wt 153.0 lb

## 2019-01-02 DIAGNOSIS — Z3A37 37 weeks gestation of pregnancy: Secondary | ICD-10-CM

## 2019-01-02 DIAGNOSIS — Z3685 Encounter for antenatal screening for Streptococcus B: Secondary | ICD-10-CM

## 2019-01-02 DIAGNOSIS — Z3480 Encounter for supervision of other normal pregnancy, unspecified trimester: Secondary | ICD-10-CM

## 2019-01-02 DIAGNOSIS — Z3483 Encounter for supervision of other normal pregnancy, third trimester: Secondary | ICD-10-CM

## 2019-01-02 NOTE — Progress Notes (Signed)
ROB shocking pain in vagina

## 2019-01-02 NOTE — Progress Notes (Signed)
    Routine Prenatal Care Visit  Subjective  Sarah Rivas is a 33 y.o. [phone] at [redacted]w[redacted]d being seen today for ongoing prenatal care.  She is currently monitored for the following issues for this low-risk pregnancy and has ANXIETY; ANXIETY, SITUATIONAL; DEPRESSION; COMMON MIGRAINE; HEARING LOSS; URI; FIBROCYSTIC BREAST DISEASE; DISORDER, MENSTRUAL NOS; SYNCOPE; CHEST PAIN; ABDOMINAL PAIN, RIGHT LOWER QUADRANT; and Supervision of other normal pregnancy, antepartum on their problem list.  ----------------------------------------------------------------------------------- Patient reports no complaints.   Contractions: Irregular. Vag. Bleeding: None.  Movement: Present. Denies leaking of fluid.  ----------------------------------------------------------------------------------- The following portions of the patient's history were reviewed and updated as appropriate: allergies, current medications, past family history, past medical history, past social history, past surgical history and problem list. Problem list updated.   Objective  Blood pressure (!) 98/56, weight 153 lb (69.4 kg), last menstrual period 04/17/2018. Pregravid weight 128 lb (58.1 kg) Total Weight Gain 25 lb (11.3 kg) Urinalysis:      Fetal Status: Fetal Heart Rate (bpm): 140 Fundal Height: 36 cm Movement: Present  Presentation: Vertex  General:  Alert, oriented and cooperative. Patient is in no acute distress.  Skin: Skin is warm and dry. No rash noted.   Cardiovascular: Normal heart rate noted  Respiratory: Normal respiratory effort, no problems with respiration noted  Abdomen: Soft, gravid, appropriate for gestational age. Pain/Pressure: Present     Pelvic:  Cervical exam performed Dilation: 2 Effacement (%): 50 Station: -3  Extremities: Normal range of motion.  Edema: None  ental Status: Normal Rivas and affect. Normal behavior. Normal judgment and thought content.     Assessment   33 y.o. [id number] at [redacted]w[redacted]d by   01/22/2019, by Last Menstrual Period presenting for routine prenatal visit  Plan   Pregnancy #4 Problems (from 04/17/18 to present)    Problem Noted Resolved   Supervision of other normal pregnancy, antepartum 06/10/2018 by Sarah Rivas, CNM No   Overview Addendum 11/04/2018 10:00 AM by Sarah Fellers, MD    [facility] Prenatal Labs  Dating L=8 Blood type: A/Positive/-- (06/05 1020)   Genetic Screen Declines Antibody:Negative (06/05 1020)  Anatomic [place] Complete 08/19/2018 Rubella: 1.83 (06/05 1020) Varicella: Immune  GTT   Third trimester: 132 RPR: Non Reactive (06/05 1020)   Rhogam N/A HBsAg: Negative (06/05 1020)   TDaP vaccine 11/04/2018                       Flu Shot:DECLINES HIV: Non Reactive (06/05 1020)   Baby Food                                GBS:   Contraception Tubal ligation- consent signed 11/04/2018 Pap: 06/06/2018, NILM and HPV Negative  CBB     CS/VBAC    Support Person Sarah Rivas                 Gestational age appropriate obstetric precautions including but not limited to vaginal bleeding, contractions, leaking of fluid and fetal movement were reviewed in detail with the patient.    - GBS collected  Return in about 1 week (around 01/09/2019) for [place].  Sarah Mood, MD, [facility], [facility] 01/02/2019, 1:22 PM

## 2019-01-04 LAB — STREP GP B NAA: Strep Gp B NAA: NEGATIVE

## 2019-01-06 NOTE — L&D Delivery Note (Signed)
Vaginal Delivery Note  Spontaneous delivery of live viable female infant from the ROA position through an intact perineum. Delivery of anterior left shoulder with gentle downward guidance followed by delivery of the right posterior shoulder with gentle upward guidance. Body followed spontaneously. Infant placed on maternal chest. Nursery present and helped with neonatal resuscitation and evaluation. Cord clamped and cut after one minute. Cord blood nor collected. Placenta delivered spontaneously and intact with a 3 vessel cord.  No laceration. Uterus firm and below umbilicus at the end of the delivery.  Mom and baby recovering in stable condition. Sponge and needle counts were correct at the end of the delivery.  APGARS: 1 minute:8 5 minutes: 9 Weight: pending EPIDURAL Desires BTL.  Adrian Prows MD [facility], [facility] 01/17/19 1:31 PM

## 2019-01-10 ENCOUNTER — Ambulatory Visit (INDEPENDENT_AMBULATORY_CARE_PROVIDER_SITE_OTHER): Payer: Medicaid Other | Admitting: Obstetrics & Gynecology

## 2019-01-10 ENCOUNTER — Encounter: Payer: Self-pay | Admitting: Obstetrics & Gynecology

## 2019-01-10 ENCOUNTER — Other Ambulatory Visit: Payer: Self-pay

## 2019-01-10 VITALS — BP 100/60 | Wt 153.0 lb

## 2019-01-10 DIAGNOSIS — Z3A38 38 weeks gestation of pregnancy: Secondary | ICD-10-CM

## 2019-01-10 DIAGNOSIS — Z3480 Encounter for supervision of other normal pregnancy, unspecified trimester: Secondary | ICD-10-CM

## 2019-01-10 DIAGNOSIS — Z3483 Encounter for supervision of other normal pregnancy, third trimester: Secondary | ICD-10-CM

## 2019-01-10 LAB — POCT URINALYSIS DIPSTICK OB
Glucose, UA: NEGATIVE
POC,PROTEIN,UA: NEGATIVE

## 2019-01-10 NOTE — H&P (View-Only) (Signed)
History and Physical  Sarah Rivas is a 34 y.o. [id number] [redacted]w[redacted]d  for Induction of Labor scheduled due to Favorable cervix at term .   See labor record for pregnancy highlights.  No recent pain, bleeding, ruptured membranes, or other signs of progressing labor.  PMHx: She  has a past medical history of No pertinent past medical history. Also,  has a past surgical history that includes Tonsillectomy and Dilation and curettage of uterus., family history includes Lung cancer (age of onset: 66) in her maternal grandfather; Prostate cancer (age of onset: 75) in her maternal grandfather.,  reports that she has never smoked. She has never used smokeless tobacco. She reports that she does not drink alcohol or use drugs. She has a current medication list which includes the following prescription(s): buspirone, folic acid, and prenatal vitamin plus low iron. Also, is allergic to codeine and sertraline hcl. OB History  Gravida Para Term Preterm AB Living  5 3 3  0 1 3  SAB TAB Ectopic Multiple Live Births  1 0 0 0 3    # Outcome Date GA Lbr Len/2nd Weight Sex Delivery Anes PTL Lv  5 Current           4 Term 08/29/15 [redacted]w[redacted]d / 00:18 6 lb 8.8 oz (2.97 kg) M Vag-Spont EPI  LIV  3 Term 05/25/11        LIV  2 Term 08/17/09     Vag-Spont   LIV  1 SAB           Patient denies any other pertinent gynecologic issues.   Review of Systems  All other systems reviewed and are negative.   Objective: BP 100/60   Wt 153 lb (69.4 kg)   LMP 04/17/2018 (Exact Date)   BMI 27.10 kg/m  Physical Exam Constitutional:      General: She is not in acute distress.    Appearance: She is well-developed.  Genitourinary:     Pelvic exam was performed with patient supine.     Vagina, uterus and rectum normal.     No lesions in the vagina.     No vaginal bleeding.     No cervical motion tenderness, friability, lesion or polyp.     Uterus is mobile.     Uterus is not enlarged.     No uterine mass detected.    Uterus  is midaxial.     No right or left adnexal mass present.     Right adnexa not tender.     Left adnexa not tender.     Genitourinary Comments: 2/50/-3  HENT:     Head: Normocephalic and atraumatic. No laceration.     Right Ear: Hearing normal.     Left Ear: Hearing normal.     Mouth/Throat:     Pharynx: Uvula midline.  Eyes:     Pupils: Pupils are equal, round, and reactive to light.  Neck:     Thyroid: No thyromegaly.  Cardiovascular:     Rate and Rhythm: Normal rate and regular rhythm.     Heart sounds: No murmur. No friction rub. No gallop.   Pulmonary:     Effort: Pulmonary effort is normal. No respiratory distress.     Breath sounds: Normal breath sounds. No wheezing.  Chest:     Breasts:        Right: No mass, skin change or tenderness.        Left: No mass, skin change or tenderness.  Abdominal:  General: Bowel sounds are normal. There is no distension.     Palpations: Abdomen is soft.     Tenderness: There is no abdominal tenderness. There is no rebound.     Comments: FHT 160  Musculoskeletal:        General: Normal range of motion.     Cervical back: Normal range of motion and neck supple.  Neurological:     Mental Status: She is alert and oriented to person, place, and time.     Cranial Nerves: No cranial nerve deficit.  Skin:    General: Skin is warm and dry.  Psychiatric:        Judgment: Judgment normal.  Vitals reviewed.     Assessment: Term Pregnancy for Induction of Labor due to Favorable cervix at term.  Plan: Patient will undergo induction of labor with cervical ripening agents.     Patient has been fully informed of the pros and cons, risks and benefits of continued observation with fetal monitoring versus that of induction of labor.   She understands that there are uncommon risks to induction, which include but are not limited to : frequent or prolonged uterine contractions, fetal distress, uterine rupture, and lack of successful induction.  These  risks include all methods including Pitocin and Misoprostol and Cervadil.  Patient understands that using Misoprostol for labor induction is an "off label" indication although it has been studied extensively for this purpose and is an accepted method of induction.  She also has been informed of the increased risks for Cesarean with induction and should induction not be successful.  Patient consents to the induction plan of management.  Plans to breast feed Plans bilateral tubal ligation for contraception TDaP UTD  Barnett Applebaum, MD, [facility], [facility] 01/10/2019  1:34 PM

## 2019-01-10 NOTE — Patient Instructions (Addendum)
PRE ADMISSION TESTING For Covid, prior to procedure Friday 9:00-10:00 [facility] entrance (drive up)  Results in 48-72 hours You will not receive notification if test results are negative. If positive for Covid19, your provider will notify you by phone, with additional instructions.   Labor Induction Mon-Tues Jan 12 at [facility] induction is when steps are taken to cause a pregnant woman to begin the labor process. Most women go into labor on their own between 37 weeks and 42 weeks of pregnancy. When this does not happen or when there is a medical need for labor to begin, steps may be taken to induce labor. Labor induction causes a pregnant woman's uterus to contract. It also causes the cervix to soften (ripen), open (dilate), and thin out (efface). Usually, labor is not induced before 39 weeks of pregnancy unless there is a medical reason to do so. Your health care provider will determine if labor induction is needed. Before inducing labor, your health care provider will consider a number of factors, including:  Your medical condition and your baby's.  How many weeks along you are in your pregnancy.  How mature your baby's lungs are.  The condition of your cervix.  The position of your baby.  The size of your birth canal. What are some reasons for labor induction? Labor may be induced if:  Your health or your baby's health is at risk.  Your pregnancy is overdue by 1 week or more.  Your water breaks but labor does not start on its own.  There is a low amount of amniotic fluid around your baby. You may also choose (elect) to have labor induced at a certain time. Generally, elective labor induction is done no earlier than 39 weeks of pregnancy. What methods are used for labor induction? Methods used for labor induction include:  Prostaglandin medicine. This medicine starts contractions and causes the cervix to dilate and ripen. It can be taken by mouth (orally)  or by being inserted into the vagina (suppository).  Inserting a small, thin tube (catheter) with a balloon into the vagina and then expanding the balloon with water to dilate the cervix.  Stripping the membranes. In this method, your health care provider gently separates amniotic sac tissue from the cervix. This causes the cervix to stretch, which in turn causes the release of a hormone called progesterone. The hormone causes the uterus to contract. This procedure is often done during an office visit, after which you will be sent home to wait for contractions to begin.  Breaking the water. In this method, your health care provider uses a small instrument to make a small hole in the amniotic sac. This eventually causes the amniotic sac to break. Contractions should begin after a few hours.  Medicine to trigger or strengthen contractions. This medicine is given through an IV that is inserted into a vein in your arm. Except for membrane stripping, which can be done in a clinic, labor induction is done in the hospital so that you and your baby can be carefully monitored. How long does it take for labor to be induced? The length of time it takes to induce labor depends on how ready your body is for labor. Some inductions can take up to 2-3 days, while others may take less than a day. Induction may take longer if:  You are induced early in your pregnancy.  It is your first pregnancy.  Your cervix is not ready. What are some risks associated  with labor induction? Some risks associated with labor induction include:  Changes in fetal heart rate, such as being too high, too low, or irregular (erratic).  Failed induction.  Infection in the mother or the baby.  Increased risk of having a cesarean delivery.  Fetal death.  Breaking off (abruption) of the placenta from the uterus (rare).  Rupture of the uterus (very rare). When induction is needed for medical reasons, the benefits of induction  generally outweigh the risks. What are some reasons for not inducing labor? Labor induction should not be done if:  Your baby does not tolerate contractions.  You have had previous surgeries on your uterus, such as a myomectomy, removal of fibroids, or a vertical scar from a previous cesarean delivery.  Your placenta lies very low in your uterus and blocks the opening of the cervix (placenta previa).  Your baby is not in a head-down position.  The umbilical cord drops down into the birth canal in front of the baby.  There are unusual circumstances, such as the baby being very early (premature).  You have had more than 2 previous cesarean deliveries. Summary  Labor induction is when steps are taken to cause a pregnant woman to begin the labor process.  Labor induction causes a pregnant woman's uterus to contract. It also causes the cervix to ripen, dilate, and efface.  Labor is not induced before 39 weeks of pregnancy unless there is a medical reason to do so.  When induction is needed for medical reasons, the benefits of induction generally outweigh the risks. This information is not intended to replace advice given to you by your health care provider. Make sure you discuss any questions you have with your health care provider. Document Revised: 12/25/2016 Document Reviewed: 02/05/2016 Elsevier Patient Education  2020 [employer].

## 2019-01-10 NOTE — Progress Notes (Signed)
  [facility] INDUCTION ASSESSMENT SCHEDULING Sarah Rivas 1985/05/16 Medical record #: [id number] Phone #:  Home Phone [phone]  Mobile [phone]    Prenatal Provider:[facility] Proposed admission date/time:1/11-1/12 at [facility] Method of induction:Cytotec  Weight: Filed Weights01/05/21 1317Weight:153 lb (69.4 kg) BMI Body mass index is 27.1 kg/m. HIV Negative HSV Negative EDC Estimated Date of Delivery: 1/17/21based on:LMP  Gestational age on admission: 36 Gravidity/parity:[id number]  Cervix Score   0 1 2 3   Position Posterior Midposition Anterior   Consistency Firm Medium Soft   Effacement (%) 0-30 40-50 60-70 >80  Dilation (cm) Closed 1-2 3-4 >5  Baby's station -3 -2 -1 +1, +2   Bishop Score:5   Medical induction of labor  select indication(s) below Elective induction ?39 weeks multiparous patient ?39 weeks primiparous patient with Bishop score ?7 ?40 weeks primiparous patient   Medical Indications Adapted from [place] #[id number], "Medically Indicated Late Preterm and Early Term Deliveries," 2013.  Provider Signature: Hoyt Koch Scheduled [id number] Date:01/10/2019 1:33 PM   Call [phone] to finalize the induction date/time  [id number] (07/17)

## 2019-01-10 NOTE — Progress Notes (Signed)
History and Physical  Sarah Rivas is a 34 y.o. [id number] [redacted]w[redacted]d  for Induction of Labor scheduled due to Favorable cervix at term .   See labor record for pregnancy highlights.  No recent pain, bleeding, ruptured membranes, or other signs of progressing labor.  PMHx: She  has a past medical history of No pertinent past medical history. Also,  has a past surgical history that includes Tonsillectomy and Dilation and curettage of uterus., family history includes Lung cancer (age of onset: 55) in her maternal grandfather; Prostate cancer (age of onset: 66) in her maternal grandfather.,  reports that she has never smoked. She has never used smokeless tobacco. She reports that she does not drink alcohol or use drugs. She has a current medication list which includes the following prescription(s): buspirone, folic acid, and prenatal vitamin plus low iron. Also, is allergic to codeine and sertraline hcl. OB History  Gravida Para Term Preterm AB Living  5 3 3  0 1 3  SAB TAB Ectopic Multiple Live Births  1 0 0 0 3    # Outcome Date GA Lbr Len/2nd Weight Sex Delivery Anes PTL Lv  5 Current           4 Term 08/29/15 [redacted]w[redacted]d / 00:18 6 lb 8.8 oz (2.97 kg) M Vag-Spont EPI  LIV  3 Term 05/25/11        LIV  2 Term 08/17/09     Vag-Spont   LIV  1 SAB           Patient denies any other pertinent gynecologic issues.   Review of Systems  All other systems reviewed and are negative.   Objective: BP 100/60   Wt 153 lb (69.4 kg)   LMP 04/17/2018 (Exact Date)   BMI 27.10 kg/m  Physical Exam Constitutional:      General: She is not in acute distress.    Appearance: She is well-developed.  Genitourinary:     Pelvic exam was performed with patient supine.     Vagina, uterus and rectum normal.     No lesions in the vagina.     No vaginal bleeding.     No cervical motion tenderness, friability, lesion or polyp.     Uterus is mobile.     Uterus is not enlarged.     No uterine mass detected.    Uterus  is midaxial.     No right or left adnexal mass present.     Right adnexa not tender.     Left adnexa not tender.     Genitourinary Comments: 2/50/-3  HENT:     Head: Normocephalic and atraumatic. No laceration.     Right Ear: Hearing normal.     Left Ear: Hearing normal.     Mouth/Throat:     Pharynx: Uvula midline.  Eyes:     Pupils: Pupils are equal, round, and reactive to light.  Neck:     Thyroid: No thyromegaly.  Cardiovascular:     Rate and Rhythm: Normal rate and regular rhythm.     Heart sounds: No murmur. No friction rub. No gallop.   Pulmonary:     Effort: Pulmonary effort is normal. No respiratory distress.     Breath sounds: Normal breath sounds. No wheezing.  Chest:     Breasts:        Right: No mass, skin change or tenderness.        Left: No mass, skin change or tenderness.  Abdominal:  General: Bowel sounds are normal. There is no distension.     Palpations: Abdomen is soft.     Tenderness: There is no abdominal tenderness. There is no rebound.     Comments: FHT 160  Musculoskeletal:        General: Normal range of motion.     Cervical back: Normal range of motion and neck supple.  Neurological:     Mental Status: She is alert and oriented to person, place, and time.     Cranial Nerves: No cranial nerve deficit.  Skin:    General: Skin is warm and dry.  Psychiatric:        Judgment: Judgment normal.  Vitals reviewed.     Assessment: Term Pregnancy for Induction of Labor due to Favorable cervix at term.  Plan: Patient will undergo induction of labor with cervical ripening agents.     Patient has been fully informed of the pros and cons, risks and benefits of continued observation with fetal monitoring versus that of induction of labor.   She understands that there are uncommon risks to induction, which include but are not limited to : frequent or prolonged uterine contractions, fetal distress, uterine rupture, and lack of successful induction.  These  risks include all methods including Pitocin and Misoprostol and Cervadil.  Patient understands that using Misoprostol for labor induction is an "off label" indication although it has been studied extensively for this purpose and is an accepted method of induction.  She also has been informed of the increased risks for Cesarean with induction and should induction not be successful.  Patient consents to the induction plan of management.  Plans to breast feed Plans bilateral tubal ligation for contraception TDaP UTD  Sarah Applebaum, MD, [facility], [facility] 01/10/2019  1:34 PM

## 2019-01-13 ENCOUNTER — Other Ambulatory Visit
Admission: RE | Admit: 2019-01-13 | Discharge: 2019-01-13 | Disposition: A | Discharge status: Home / Self Care | Payer: Medicaid Other | Source: Ambulatory Visit | Attending: Obstetrics & Gynecology | Admitting: Obstetrics & Gynecology

## 2019-01-13 DIAGNOSIS — Z20822 Contact with and (suspected) exposure to covid-19: Secondary | ICD-10-CM | POA: Insufficient documentation

## 2019-01-13 DIAGNOSIS — Z01812 Encounter for preprocedural laboratory examination: Secondary | ICD-10-CM | POA: Diagnosis present

## 2019-01-14 LAB — SARS CORONAVIRUS 2 (TAT 6-24 HRS): SARS Coronavirus 2: NEGATIVE

## 2019-01-17 ENCOUNTER — Inpatient Hospital Stay: Payer: Medicaid Other | Admitting: Certified Registered Nurse Anesthetist

## 2019-01-17 ENCOUNTER — Inpatient Hospital Stay
Admission: EM | Admit: 2019-01-17 | Discharge: 2019-01-18 | DRG: 798 | Disposition: A | Discharge status: Home / Self Care | Payer: Medicaid Other | Attending: Obstetrics & Gynecology | Admitting: Obstetrics & Gynecology

## 2019-01-17 ENCOUNTER — Encounter: Payer: Self-pay | Admitting: Obstetrics & Gynecology

## 2019-01-17 ENCOUNTER — Other Ambulatory Visit: Payer: Self-pay

## 2019-01-17 DIAGNOSIS — Z302 Encounter for sterilization: Secondary | ICD-10-CM | POA: Diagnosis not present

## 2019-01-17 DIAGNOSIS — Z885 Allergy status to narcotic agent status: Secondary | ICD-10-CM

## 2019-01-17 DIAGNOSIS — Z3A39 39 weeks gestation of pregnancy: Secondary | ICD-10-CM

## 2019-01-17 DIAGNOSIS — O26893 Other specified pregnancy related conditions, third trimester: Secondary | ICD-10-CM | POA: Diagnosis present

## 2019-01-17 DIAGNOSIS — Z3480 Encounter for supervision of other normal pregnancy, unspecified trimester: Secondary | ICD-10-CM

## 2019-01-17 DIAGNOSIS — Z3A38 38 weeks gestation of pregnancy: Secondary | ICD-10-CM

## 2019-01-17 HISTORY — DX: Depression, unspecified: F32.A

## 2019-01-17 HISTORY — DX: Anxiety disorder, unspecified: F41.9

## 2019-01-17 LAB — CBC
HCT: 33.7 % — ABNORMAL LOW (ref 36.0–46.0)
Hemoglobin: 11.6 g/dL — ABNORMAL LOW (ref 12.0–15.0)
MCH: 31.5 pg (ref 26.0–34.0)
MCHC: 34.4 g/dL (ref 30.0–36.0)
MCV: 91.6 fL (ref 80.0–100.0)
Platelets: 266 10*3/uL (ref 150–400)
RBC: 3.68 MIL/uL — ABNORMAL LOW (ref 3.87–5.11)
RDW: 13.2 % (ref 11.5–15.5)
WBC: 12.1 10*3/uL — ABNORMAL HIGH (ref 4.0–10.5)
nRBC: 0 % (ref 0.0–0.2)

## 2019-01-17 LAB — TYPE AND SCREEN
ABO/RH(D): A POS
Antibody Screen: NEGATIVE

## 2019-01-17 MED ORDER — FENTANYL 2.5 MCG/ML W/ROPIVACAINE 0.15% IN NS 100 ML EPIDURAL (ARMC)
12.0000 mL/h | EPIDURAL | Status: DC
  Administered 2019-01-17: 12 mL/h via EPIDURAL

## 2019-01-17 MED ORDER — ACETAMINOPHEN 325 MG PO TABS: 650.0000 mg | ORAL_TABLET | ORAL | Status: DC | PRN

## 2019-01-17 MED ORDER — PHENYLEPHRINE 40 MCG/ML (10ML) SYRINGE FOR IV PUSH (FOR BLOOD PRESSURE SUPPORT): 80.0000 ug | PREFILLED_SYRINGE | INTRAVENOUS | Status: DC | PRN

## 2019-01-17 MED ORDER — SODIUM CHLORIDE 0.9 % IV SOLN
2.0000 g | INTRAVENOUS | Status: DC
  Filled 2019-01-17: qty 2

## 2019-01-17 MED ORDER — BUTORPHANOL TARTRATE 1 MG/ML IJ SOLN: 1.0000 mg | INTRAMUSCULAR | Status: DC | PRN

## 2019-01-17 MED ORDER — LACTATED RINGERS IV SOLN: 500.0000 mL | INTRAVENOUS | Status: DC | PRN

## 2019-01-17 MED ORDER — FENTANYL-BUPIVACAINE-NACL 0.5-0.125-0.9 MG/250ML-% EP SOLN: 12.0000 mL/h | EPIDURAL | Status: DC | PRN

## 2019-01-17 MED ORDER — MISOPROSTOL 200 MCG PO TABS
ORAL_TABLET | ORAL | Status: AC
  Filled 2019-01-17: qty 4

## 2019-01-17 MED ORDER — EPHEDRINE 5 MG/ML INJ: 10.0000 mg | INTRAVENOUS | Status: DC | PRN

## 2019-01-17 MED ORDER — DIBUCAINE (PERIANAL) 1 % EX OINT: 1.0000 "application " | TOPICAL_OINTMENT | CUTANEOUS | Status: DC | PRN

## 2019-01-17 MED ORDER — LACTATED RINGERS IV SOLN: INTRAVENOUS | Status: DC

## 2019-01-17 MED ORDER — BENZOCAINE-MENTHOL 20-0.5 % EX AERO: 1.0000 "application " | INHALATION_SPRAY | CUTANEOUS | Status: DC | PRN

## 2019-01-17 MED ORDER — OXYTOCIN BOLUS FROM INFUSION
500.0000 mL | Freq: Once | INTRAVENOUS | Status: AC
  Administered 2019-01-17: 500 mL via INTRAVENOUS

## 2019-01-17 MED ORDER — OXYTOCIN 40 UNITS IN NORMAL SALINE INFUSION - SIMPLE MED
1.0000 m[IU]/min | INTRAVENOUS | Status: DC
  Administered 2019-01-17 (×2): 2 m[IU]/min via INTRAVENOUS

## 2019-01-17 MED ORDER — SIMETHICONE 80 MG PO CHEW: 80.0000 mg | CHEWABLE_TABLET | ORAL | Status: DC | PRN

## 2019-01-17 MED ORDER — OXYTOCIN 40 UNITS IN NORMAL SALINE INFUSION - SIMPLE MED
2.5000 [IU]/h | INTRAVENOUS | Status: DC
  Administered 2019-01-17: 14:00:00 2.5 [IU]/h via INTRAVENOUS

## 2019-01-17 MED ORDER — ONDANSETRON HCL 4 MG PO TABS: 4.0000 mg | ORAL_TABLET | ORAL | Status: DC | PRN

## 2019-01-17 MED ORDER — DOCUSATE SODIUM 100 MG PO CAPS
100.0000 mg | ORAL_CAPSULE | Freq: Two times a day (BID) | ORAL | Status: DC
  Administered 2019-01-17: 21:00:00 100 mg via ORAL
  Filled 2019-01-17: qty 1

## 2019-01-17 MED ORDER — BUSPIRONE HCL 15 MG PO TABS
7.5000 mg | ORAL_TABLET | Freq: Two times a day (BID) | ORAL | Status: DC
  Administered 2019-01-17: 21:00:00 7.5 mg via ORAL
  Filled 2019-01-17 (×3): qty 1

## 2019-01-17 MED ORDER — DIPHENHYDRAMINE HCL 50 MG/ML IJ SOLN: 12.5000 mg | INTRAMUSCULAR | Status: DC | PRN

## 2019-01-17 MED ORDER — LIDOCAINE HCL (PF) 1 % IJ SOLN
INTRAMUSCULAR | Status: AC
  Filled 2019-01-17: qty 30

## 2019-01-17 MED ORDER — ZOLPIDEM TARTRATE 5 MG PO TABS: 5.0000 mg | ORAL_TABLET | Freq: Every evening | ORAL | Status: DC | PRN

## 2019-01-17 MED ORDER — LIDOCAINE HCL (PF) 1 % IJ SOLN
INTRAMUSCULAR | Status: DC | PRN
  Administered 2019-01-17: 3 mL

## 2019-01-17 MED ORDER — FENTANYL 2.5 MCG/ML W/ROPIVACAINE 0.15% IN NS 100 ML EPIDURAL (ARMC)
EPIDURAL | Status: AC
  Filled 2019-01-17: qty 100

## 2019-01-17 MED ORDER — BUPIVACAINE HCL (PF) 0.25 % IJ SOLN
INTRAMUSCULAR | Status: DC | PRN
  Administered 2019-01-17: 2 mL via EPIDURAL
  Administered 2019-01-17: 4 mL via EPIDURAL

## 2019-01-17 MED ORDER — LIDOCAINE HCL (PF) 1 % IJ SOLN: 30.0000 mL | INTRAMUSCULAR | Status: DC | PRN

## 2019-01-17 MED ORDER — ACETAMINOPHEN 500 MG PO TABS
1000.0000 mg | ORAL_TABLET | Freq: Four times a day (QID) | ORAL | Status: DC
  Administered 2019-01-17 – 2019-01-18 (×3): 1000 mg via ORAL
  Filled 2019-01-17 (×3): qty 2

## 2019-01-17 MED ORDER — DIPHENHYDRAMINE HCL 25 MG PO CAPS: 25.0000 mg | ORAL_CAPSULE | Freq: Four times a day (QID) | ORAL | Status: DC | PRN

## 2019-01-17 MED ORDER — ONDANSETRON HCL 4 MG/2ML IJ SOLN: 4.0000 mg | Freq: Four times a day (QID) | INTRAMUSCULAR | Status: DC | PRN

## 2019-01-17 MED ORDER — OXYTOCIN 10 UNIT/ML IJ SOLN
INTRAMUSCULAR | Status: AC
  Filled 2019-01-17: qty 2

## 2019-01-17 MED ORDER — MISOPROSTOL 25 MCG QUARTER TABLET
ORAL_TABLET | ORAL | Status: AC
  Filled 2019-01-17: qty 1

## 2019-01-17 MED ORDER — MISOPROSTOL 25 MCG QUARTER TABLET
25.0000 ug | ORAL_TABLET | ORAL | Status: DC | PRN
  Administered 2019-01-17: 01:00:00 25 ug via VAGINAL

## 2019-01-17 MED ORDER — TERBUTALINE SULFATE 1 MG/ML IJ SOLN: 0.2500 mg | Freq: Once | INTRAMUSCULAR | Status: DC | PRN

## 2019-01-17 MED ORDER — AMMONIA AROMATIC IN INHA
RESPIRATORY_TRACT | Status: AC
  Filled 2019-01-17: qty 10

## 2019-01-17 MED ORDER — IBUPROFEN 600 MG PO TABS
600.0000 mg | ORAL_TABLET | Freq: Four times a day (QID) | ORAL | Status: DC
  Administered 2019-01-17 – 2019-01-18 (×3): 600 mg via ORAL
  Filled 2019-01-17 (×3): qty 1

## 2019-01-17 MED ORDER — WITCH HAZEL-GLYCERIN EX PADS: 1.0000 "application " | MEDICATED_PAD | CUTANEOUS | Status: DC | PRN

## 2019-01-17 MED ORDER — LIDOCAINE-EPINEPHRINE (PF) 1.5 %-1:200000 IJ SOLN
INTRAMUSCULAR | Status: DC | PRN
  Administered 2019-01-17: 3 mL via PERINEURAL

## 2019-01-17 MED ORDER — ONDANSETRON HCL 4 MG/2ML IJ SOLN: 4.0000 mg | INTRAMUSCULAR | Status: DC | PRN

## 2019-01-17 MED ORDER — LACTATED RINGERS IV SOLN
500.0000 mL | Freq: Once | INTRAVENOUS | Status: AC
  Administered 2019-01-17: 500 mL via INTRAVENOUS

## 2019-01-17 MED ORDER — OXYTOCIN 40 UNITS IN NORMAL SALINE INFUSION - SIMPLE MED
INTRAVENOUS | Status: AC
  Filled 2019-01-17: qty 1000

## 2019-01-17 MED ORDER — PRENATAL MULTIVITAMIN CH: 1.0000 | ORAL_TABLET | Freq: Every day | ORAL | Status: DC

## 2019-01-17 MED ORDER — COCONUT OIL OIL: 1.0000 "application " | TOPICAL_OIL | Status: DC | PRN

## 2019-01-17 NOTE — H&P (Signed)
History and Physical Interval Note:  01/17/2019 8:28 AM  Sarah Rivas  has presented today for INDUCTION OF LABOR (cervical ripening agents),  with the diagnosis of Favorable cervix at term. The various methods of treatment have been discussed with the patient and family. After consideration of risks, benefits and other options for treatment, the patient has consented to  Labor induction .  The patient's history has been reviewed, patient examined, no change in status, and is stable for induction as planned.  See H&P. I have reviewed the patient's chart and labs.  Questions were answered to the patient's satisfaction.     Rod Can, [facility] 01/17/2019  8:28 AM

## 2019-01-17 NOTE — Anesthesia Preprocedure Evaluation (Signed)
Anesthesia Evaluation  Patient identified by MRN, date of birth, ID band Patient awake    Reviewed: Allergy & Precautions, NPO status , Patient's Chart, lab work & pertinent test results  Airway Mallampati: II       Dental no notable dental hx.    Pulmonary neg pulmonary ROS,    Pulmonary exam normal        Cardiovascular Exercise Tolerance: Good negative cardio ROS Normal cardiovascular exam     Neuro/Psych  Headaches, Anxiety Depression    GI/Hepatic negative GI ROS,   Endo/Other  negative endocrine ROS  Renal/GU negative Renal ROS  negative genitourinary   Musculoskeletal   Abdominal   Peds  Hematology negative hematology ROS (+)   Anesthesia Other Findings   Reproductive/Obstetrics (+) Pregnancy                             Anesthesia Physical Anesthesia Plan  ASA: II  Anesthesia Plan: Epidural   Post-op Pain Management:    Induction:   PONV Risk Score and Plan:   Airway Management Planned:   Additional Equipment:   Intra-op Plan:   Post-operative Plan:   Informed Consent: I have reviewed the patients History and Physical, chart, labs and discussed the procedure including the risks, benefits and alternatives for the proposed anesthesia with the patient or authorized representative who has indicated his/her understanding and acceptance.     Dental Advisory Given  Plan Discussed with: Anesthesiologist  Anesthesia Plan Comments:         Anesthesia Quick Evaluation

## 2019-01-17 NOTE — Anesthesia Procedure Notes (Addendum)
Epidural Patient location during procedure: OB Start time: 01/17/2019 9:41 AM End time: 01/17/2019 9:47 AM  Staffing Anesthesiologist: Emmie Niemann, MD Resident/CRNA: Jerrye Noble, CRNA Performed: resident/CRNA   Preanesthetic Checklist Completed: patient identified, IV checked, site marked, risks and benefits discussed, surgical consent, monitors and equipment checked, pre-op evaluation and timeout performed  Epidural Patient position: sitting Prep: ChloraPrep Patient monitoring: heart rate, continuous pulse ox and blood pressure Approach: midline Location: L3-L4 Injection technique: LOR saline  Needle:  Needle type: Tuohy  Needle gauge: 17 G Needle length: 9 cm and 9 Needle insertion depth: 6 cm Catheter type: closed end flexible Catheter size: 19 Gauge Catheter at skin depth: 11 cm Test dose: negative and 1.5% lidocaine with Epi 1:200 K  Assessment Sensory level: T10 Events: blood not aspirated, injection not painful, no injection resistance, no paresthesia and negative IV test  Additional Notes 1 attempt Pt. Evaluated and documentation done after procedure finished. Patient identified. Risks/Benefits/Options discussed with patient including but not limited to bleeding, infection, nerve damage, paralysis, failed block, incomplete pain control, headache, blood pressure changes, nausea, vomiting, reactions to medication both or allergic, itching and postpartum back pain. Confirmed with bedside nurse the patient's most recent platelet count. Confirmed with patient that they are not currently taking any anticoagulation, have any bleeding history or any family history of bleeding disorders. Patient expressed understanding and wished to proceed. All questions were answered. Sterile technique was used throughout the entire procedure. Please see nursing notes for vital signs. Test dose was given through epidural catheter and negative prior to continuing to dose epidural or start  infusion. Warning signs of high block given to the patient including shortness of breath, tingling/numbness in hands, complete motor block, or any concerning symptoms with instructions to call for help. Patient was given instructions on fall risk and not to get out of bed. All questions and concerns addressed with instructions to call with any issues or inadequate analgesia.   Patient tolerated the insertion well without immediate complications.Reason for block:procedure for pain

## 2019-01-17 NOTE — Progress Notes (Signed)
Per anesthesia remove patients epidural

## 2019-01-17 NOTE — Discharge Summary (Signed)
OB Discharge Summary     Patient Name: Sarah Rivas DOB: 15-Dec-1985 MRN: [id number]  Date of admission: 01/17/2019 Delivering MD: Dr Adrian Prows Date of Delivery: 01/17/2019  Date of discharge: 01/18/2019  Admitting diagnosis: Normal labor and delivery [O80] Intrauterine pregnancy: [redacted]w[redacted]d     Secondary diagnosis: Undesired fertility     Discharge diagnosis: Term Pregnancy Delivered, Postpartum sterilization                         Hospital course:  Induction of Labor With Vaginal Delivery   34 y.o. yo [id number] at [redacted]w[redacted]d was admitted to the hospital 01/17/2019 for induction of labor.  Indication for induction: Favorable cervix at term.  Patient had an uncomplicated labor course as follows: Membrane Rupture Time/Date: 12:05 PM ,01/17/2019   Intrapartum Procedures: Episiotomy:                                          Lacerations:    Patient had delivery of a Viable infant.  Information for the patient's newborn:  Arianah, Poorman [id number]  Delivery Method: Vag-Spont    01/17/2019  Details of delivery can be found in separate delivery note.  Patient had a  postpartum course remarkable for an uncomplicated postpartum tubal ligation on PPD #1. Patient is discharged home 01/18/19 after meeting discharge goals of tolerating a regular diet, voiding without difficulty, ambulating without assist, and having stable vital signs and being afebrile.                                                              Post partum procedures:postpartum tubal ligation  Complications: None  Physical exam on 01/18/2019: Vitals:   01/18/19 1030 01/18/19 1052 01/18/19 1125 01/18/19 1420  BP: (!) 104/57 (!) 108/57 101/68 108/73  Pulse: 63 60 (!) 58 64  Resp: 12 14 16 17   Temp:  (!) 97 F (36.1 C) 97.7 F (36.5 C) 97.8 F (36.6 C)  TempSrc:    Oral  SpO2: 95% 94%  98%  Weight:      Height:       General: alert, cooperative and no distress Lochia: appropriate Uterine Fundus: firm/ at U/ ML/  NT Incision: clean, dry and intact DVT Evaluation: SCDs on   Labs: Lab Results  Component Value Date   WBC 13.8 (H) 01/18/2019   HGB 10.7 (L) 01/18/2019   HCT 32.4 (L) 01/18/2019   MCV 94.2 01/18/2019   PLT 242 01/18/2019   CMP Latest Ref Rng & Units 04/19/2006  Glucose 70 - 99 mg/dL 90  BUN 6 - 23 mg/dL 5(L)  Creatinine 0.4 - 1.2 mg/dL 0.9  Sodium 135 - 145 meq/L 143  Potassium 3.5 - 5.1 meq/L 4.3  Chloride 96 - 112 meq/L 108  CO2 19 - 32 meq/L 30  Calcium 8.4 - 10.5 mg/dL 9.5  Total Protein 6.0 - 8.3 g/dL 7.0  Total Bilirubin 0.3 - 1.2 mg/dL 0.4  Alkaline Phos 39 - 117 units/L 72  AST 0 - 37 units/L 26  ALT 0 - 40 units/L 34    Discharge instruction: per After Visit Summary.  Medications:  Allergies as of 01/18/2019  Reactions   Codeine    REACTION: hyper   Sertraline Hcl    REACTION: felt in "[place]"      Medication List    STOP taking these medications   folic acid 1 MG tablet Commonly known as: FOLVITE     TAKE these medications   acetaminophen 500 MG tablet Commonly known as: TYLENOL Take 2 tablets (1,000 mg total) by mouth every 6 (six) hours as needed for mild pain or moderate pain.   busPIRone 7.5 MG tablet Commonly known as: BUSPAR TAKE 1 TABLET BY MOUTH TWICE A DAY   ibuprofen 600 MG tablet Commonly known as: ADVIL Take 1 tablet (600 mg total) by mouth every 6 (six) hours.   oxyCODONE 5 MG immediate release tablet Commonly known as: Oxy IR/ROXICODONE Take 1 tablet (5 mg total) by mouth every 6 (six) hours as needed for up to 3 days for severe pain or breakthrough pain.   Prenatal Vitamin Plus Low Iron 27-1 MG Tabs Take 1 tablet by mouth daily.       Diet: routine diet  Activity: Advance as tolerated. Pelvic rest for 6 weeks.   Outpatient follow up: Follow-up Information    Schuman, Christanna R, MD Follow up in 2 week(s).   Specialty: Obstetrics and Gynecology Contact information: [address] [place]  [id number] [phone]             Postpartum contraception: Tubal Ligation Rhogam Given postpartum: no Rubella vaccine given postpartum: no Varicella vaccine given postpartum: no TDaP given antepartum or postpartum: Yes, AP  Newborn Data: Live born female [place] Birth Weight: 6 lb 1.4 oz (2760 g) APGAR: 9, 9  Newborn Delivery   Birth date/time: 01/17/2019 12:54:00 Delivery type: Vaginal, Spontaneous       Baby Feeding: Breast  Disposition:home with mother  SIGNED:  Dalia Heading, CNM 01/18/2019 2:23 PM

## 2019-01-17 NOTE — Discharge Instructions (Signed)
Discharge Instructions:   Follow-up Appointment: 2 weeks, please call the office to schedule  If there are any new medications, they have been ordered and will be available for pickup at the listed pharmacy on your way home from the hospital.   Call the office if you have any of the following: headache, visual changes, fever >101.0 F, chills, shortness of breath, breast concerns, excessive vaginal bleeding, incision drainage or problems, leg pain or redness, depression or any other concerns. If you have vaginal discharge with an odor, let your doctor know.   It is normal to bleed for up to 6 weeks. You should not soak through more than 1 pad in 1 hour. If you have a blood clot larger than your fist with continued bleeding, call your doctor.   Activity: Do not lift > 15 lbs for 6 weeks (do not lift anything heavier than your baby). No intercourse, tampons, swimming pools, hot tubs, baths (only showers) for 6 weeks.  No driving for 1-2 weeks. Do not drive while taking narcotic or opioid pain medication.  Continue taking your prenatal vitamin, especially if breastfeeding. Increase calories and fluids (water) while breastfeeding.   Your milk will come in, in the next couple of days (right now it is colostrum). You may have a slight fever when your milk comes in, but it should go away on its own.  If it does not, and rises above 101 F please call the doctor. You will also feel achy and your breasts will be firm. They will also start to leak. If you are breastfeeding, continue as you have been and you can pump/express milk for comfort.   If you have too much milk, your breasts can become engorged, which could lead to mastitis. This is an infection of the milk ducts. It can be very painful and you will need to notify your doctor to obtain a prescription for antibiotics. You can also treat it with a shower or hot/cold compress.   For concerns about your baby, please call your pediatrician.  For  breastfeeding concerns, the lactation consultant can be reached at [phone].   Postpartum blues (feelings of happy one minute and sad another minute) are normal for the first few weeks but if it gets worse let your doctor know.   Congratulations! We enjoyed caring for you and your new bundle of joy!

## 2019-01-18 ENCOUNTER — Encounter
Admission: EM | Disposition: A | Discharge status: Home / Self Care | Payer: Self-pay | Source: Home / Self Care | Attending: Obstetrics & Gynecology

## 2019-01-18 ENCOUNTER — Encounter: Payer: Self-pay | Admitting: Obstetrics & Gynecology

## 2019-01-18 ENCOUNTER — Inpatient Hospital Stay: Payer: Medicaid Other | Admitting: Anesthesiology

## 2019-01-18 DIAGNOSIS — Z302 Encounter for sterilization: Secondary | ICD-10-CM

## 2019-01-18 HISTORY — PX: TUBAL LIGATION: SHX77

## 2019-01-18 LAB — CBC
HCT: 32.4 % — ABNORMAL LOW (ref 36.0–46.0)
Hemoglobin: 10.7 g/dL — ABNORMAL LOW (ref 12.0–15.0)
MCH: 31.1 pg (ref 26.0–34.0)
MCHC: 33 g/dL (ref 30.0–36.0)
MCV: 94.2 fL (ref 80.0–100.0)
Platelets: 242 10*3/uL (ref 150–400)
RBC: 3.44 MIL/uL — ABNORMAL LOW (ref 3.87–5.11)
RDW: 13.2 % (ref 11.5–15.5)
WBC: 13.8 10*3/uL — ABNORMAL HIGH (ref 4.0–10.5)
nRBC: 0 % (ref 0.0–0.2)

## 2019-01-18 LAB — RPR: RPR Ser Ql: NONREACTIVE

## 2019-01-18 SURGERY — LIGATION, FALLOPIAN TUBE, POSTPARTUM
Anesthesia: Spinal | Laterality: Bilateral

## 2019-01-18 MED ORDER — OXYCODONE HCL 5 MG PO TABS: 5.0000 mg | ORAL_TABLET | Freq: Four times a day (QID) | ORAL | 0 refills | Status: AC | PRN

## 2019-01-18 MED ORDER — MIDAZOLAM HCL 5 MG/5ML IJ SOLN
INTRAMUSCULAR | Status: DC | PRN
  Administered 2019-01-18: 2 mg via INTRAVENOUS

## 2019-01-18 MED ORDER — BUPIVACAINE IN DEXTROSE 0.75-8.25 % IT SOLN
INTRATHECAL | Status: DC | PRN
  Administered 2019-01-18: 1.6 mL via INTRATHECAL

## 2019-01-18 MED ORDER — BUPIVACAINE LIPOSOME 1.3 % IJ SUSP
INTRAMUSCULAR | Status: AC
  Filled 2019-01-18: qty 20

## 2019-01-18 MED ORDER — LIDOCAINE HCL (CARDIAC) PF 100 MG/5ML IV SOSY
PREFILLED_SYRINGE | INTRAVENOUS | Status: DC | PRN
  Administered 2019-01-18: 100 mg via INTRAVENOUS

## 2019-01-18 MED ORDER — FENTANYL CITRATE (PF) 100 MCG/2ML IJ SOLN
INTRAMUSCULAR | Status: DC | PRN
  Administered 2019-01-18 (×4): 25 ug via INTRAVENOUS

## 2019-01-18 MED ORDER — OXYCODONE HCL 5 MG PO TABS: 5.0000 mg | ORAL_TABLET | Freq: Four times a day (QID) | ORAL | Status: DC | PRN

## 2019-01-18 MED ORDER — BUPIVACAINE HCL (PF) 0.5 % IJ SOLN
INTRAMUSCULAR | Status: AC
  Filled 2019-01-18: qty 30

## 2019-01-18 MED ORDER — OXYCODONE HCL 5 MG/5ML PO SOLN: 5.0000 mg | Freq: Once | ORAL | Status: DC | PRN

## 2019-01-18 MED ORDER — IBUPROFEN 600 MG PO TABS: 600.0000 mg | ORAL_TABLET | Freq: Four times a day (QID) | ORAL | 1 refills | Status: DC

## 2019-01-18 MED ORDER — FENTANYL CITRATE (PF) 100 MCG/2ML IJ SOLN: 25.0000 ug | INTRAMUSCULAR | Status: DC | PRN

## 2019-01-18 MED ORDER — SUCCINYLCHOLINE CHLORIDE 20 MG/ML IJ SOLN
INTRAMUSCULAR | Status: AC
  Filled 2019-01-18: qty 1

## 2019-01-18 MED ORDER — ONDANSETRON HCL 4 MG/2ML IJ SOLN
INTRAMUSCULAR | Status: DC | PRN
  Administered 2019-01-18: 4 mg via INTRAVENOUS

## 2019-01-18 MED ORDER — ONDANSETRON HCL 4 MG/2ML IJ SOLN
INTRAMUSCULAR | Status: AC
  Filled 2019-01-18: qty 2

## 2019-01-18 MED ORDER — OXYCODONE HCL 5 MG PO TABS: 5.0000 mg | ORAL_TABLET | Freq: Once | ORAL | Status: DC | PRN

## 2019-01-18 MED ORDER — FENTANYL CITRATE (PF) 100 MCG/2ML IJ SOLN
INTRAMUSCULAR | Status: AC
  Filled 2019-01-18: qty 2

## 2019-01-18 MED ORDER — EPHEDRINE SULFATE 50 MG/ML IJ SOLN
INTRAMUSCULAR | Status: DC | PRN
  Administered 2019-01-18: 5 mg via INTRAVENOUS

## 2019-01-18 MED ORDER — PHENYLEPHRINE HCL (PRESSORS) 10 MG/ML IV SOLN
INTRAVENOUS | Status: DC | PRN
  Administered 2019-01-18: 100 ug via INTRAVENOUS

## 2019-01-18 MED ORDER — ACETAMINOPHEN 500 MG PO TABS: 1000.0000 mg | ORAL_TABLET | Freq: Four times a day (QID) | ORAL | 0 refills | Status: DC | PRN

## 2019-01-18 MED ORDER — PROPOFOL 10 MG/ML IV BOLUS
INTRAVENOUS | Status: DC | PRN
  Administered 2019-01-18: 150 mg via INTRAVENOUS

## 2019-01-18 MED ORDER — ACETAMINOPHEN 10 MG/ML IV SOLN: 1000.0000 mg | Freq: Once | INTRAVENOUS | Status: DC | PRN

## 2019-01-18 MED ORDER — EPHEDRINE SULFATE 50 MG/ML IJ SOLN
INTRAMUSCULAR | Status: AC
  Filled 2019-01-18: qty 1

## 2019-01-18 MED ORDER — PROPOFOL 10 MG/ML IV BOLUS
INTRAVENOUS | Status: AC
  Filled 2019-01-18: qty 20

## 2019-01-18 MED ORDER — BUPIVACAINE LIPOSOME 1.3 % IJ SUSP
INTRAMUSCULAR | Status: DC | PRN
  Administered 2019-01-18: 10 mL

## 2019-01-18 MED ORDER — LIDOCAINE HCL (PF) 2 % IJ SOLN
INTRAMUSCULAR | Status: AC
  Filled 2019-01-18: qty 5

## 2019-01-18 MED ORDER — ONDANSETRON HCL 4 MG/2ML IJ SOLN: 4.0000 mg | Freq: Once | INTRAMUSCULAR | Status: DC | PRN

## 2019-01-18 MED ORDER — MIDAZOLAM HCL 2 MG/2ML IJ SOLN
INTRAMUSCULAR | Status: AC
  Filled 2019-01-18: qty 2

## 2019-01-18 MED ORDER — EPINEPHRINE PF 1 MG/ML IJ SOLN
INTRAMUSCULAR | Status: AC
  Filled 2019-01-18: qty 1

## 2019-01-18 SURGICAL SUPPLY — 27 items
BLADE SURG SZ11 CARB STEEL (BLADE) ×3 IMPLANT
CHLORAPREP W/TINT 26 (MISCELLANEOUS) ×3 IMPLANT
DERMABOND ADVANCED (GAUZE/BANDAGES/DRESSINGS) ×2
DERMABOND ADVANCED .7 DNX12 (GAUZE/BANDAGES/DRESSINGS) ×1 IMPLANT
DRAPE LAPAROTOMY 100X77 ABD (DRAPES) ×3 IMPLANT
ELECT CAUTERY BLADE 6.4 (BLADE) ×3 IMPLANT
ELECT REM PT RETURN 9FT ADLT (ELECTROSURGICAL) ×3
ELECTRODE REM PT RTRN 9FT ADLT (ELECTROSURGICAL) ×1 IMPLANT
GLOVE BIOGEL PI IND STRL 6.5 (GLOVE) ×2 IMPLANT
GLOVE BIOGEL PI INDICATOR 6.5 (GLOVE) ×4
GLOVE SURG SYN 6.5 ES PF (GLOVE) ×6 IMPLANT
GLOVE SURG SYN 6.5 PF PI (GLOVE) ×2 IMPLANT
GOWN STRL REUS W/ TWL LRG LVL3 (GOWN DISPOSABLE) ×2 IMPLANT
GOWN STRL REUS W/TWL LRG LVL3 (GOWN DISPOSABLE) ×4
LABEL OR SOLS (LABEL) ×3 IMPLANT
NEEDLE HYPO 22GX1.5 SAFETY (NEEDLE) ×3 IMPLANT
NS IRRIG 500ML POUR BTL (IV SOLUTION) ×3 IMPLANT
PACK BASIN MINOR ARMC (MISCELLANEOUS) ×3 IMPLANT
RETRACTOR RING XSMALL (MISCELLANEOUS) ×1 IMPLANT
RTRCTR WOUND ALEXIS 13CM XS SH (MISCELLANEOUS) ×3
SPONGE LAP 4X18 RFD (DISPOSABLE) ×3 IMPLANT
SUT CHROMIC 0 CT 1 (SUTURE) ×6 IMPLANT
SUT MNCRL 4-0 (SUTURE) ×2
SUT MNCRL 4-0 27XMFL (SUTURE) ×1
SUT VICRYL 0 AB UR-6 (SUTURE) ×3 IMPLANT
SUTURE MNCRL 4-0 27XMF (SUTURE) ×1 IMPLANT
SYR 10ML LL (SYRINGE) ×3 IMPLANT

## 2019-01-18 NOTE — Anesthesia Procedure Notes (Signed)
Procedure Name: LMA Insertion Date/Time: 01/18/2019 9:28 AM Performed by: Hedda Slade, CRNA Pre-anesthesia Checklist: Patient identified, Patient being monitored, Timeout performed, Emergency Drugs available and Suction available Patient Re-evaluated:Patient Re-evaluated prior to induction Oxygen Delivery Method: Circle system utilized Preoxygenation: Pre-oxygenation with 100% oxygen Induction Type: IV induction Ventilation: Mask ventilation without difficulty LMA: LMA inserted LMA Size: 3.5 Tube type: Oral Number of attempts: 1 Placement Confirmation: positive ETCO2 and breath sounds checked- equal and bilateral Tube secured with: Tape Dental Injury: Teeth and Oropharynx as per pre-operative assessment

## 2019-01-18 NOTE — Anesthesia Procedure Notes (Signed)
Spinal  Patient location during procedure: OR Start time: 01/18/2019 9:06 AM End time: 01/18/2019 9:10 AM Staffing Performed: resident/CRNA  Anesthesiologist: Arita Miss, MD Resident/CRNA: Hedda Slade, CRNA Preanesthetic Checklist Completed: patient identified, IV checked, site marked, risks and benefits discussed, surgical consent, monitors and equipment checked, pre-op evaluation and timeout performed Spinal Block Patient position: sitting Prep: ChloraPrep Patient monitoring: heart rate, continuous pulse ox, blood pressure and cardiac monitor Approach: midline Location: L3-4 Injection technique: single-shot Needle Needle type: Whitacre and Introducer  Needle gauge: 24 G Needle length: 9 cm Assessment Sensory level: T4 Additional Notes Negative paresthesia. Negative blood return. Positive free-flowing CSF. Expiration date of kit checked and confirmed. Patient tolerated procedure well, without complications.

## 2019-01-18 NOTE — Lactation Note (Signed)
This note was copied from a baby's chart. Lactation Consultation Note  Patient Name: Sarah Khisha Snellen [id number] Date: 01/18/2019 Reason for consult: Initial assessment  LC in to meet with family and baby Terrilyn Saver. Mom is unavailable, getting BTL. This is mom's 4th baby, all 3 children have been breastfed, with oldest being breast and formula. Dad reports that mom hasn't had any trouble with baby feeding. Terrilyn Saver has exceeded wet and stool diaper expectations, and is up 1% in body weight per chart. [place] reviewed with dad breastfeeding basics, early hunger cues, growth spurts/cluster feeding, and tracking wet/dirty diapers. Information for outpatient lactation consult services given as well as breastfeeding support within the community. [place] contact number pointed out to dad, encouraged to call for assistance if needed.  Maternal Data Formula Feeding for Exclusion: No Does the patient have breastfeeding experience prior to this delivery?: Yes  Feeding Feeding Type: Breast Fed  LATCH Score                   Interventions Interventions: Breast feeding basics reviewed  Lactation Tools Discussed/Used     Consult Status Consult Status: Complete    Lavonia Drafts 01/18/2019, 10:55 AM

## 2019-01-18 NOTE — Anesthesia Postprocedure Evaluation (Signed)
Anesthesia Post Note  Patient: Sarah Rivas  Procedure(s) Performed: POST PARTUM TUBAL LIGATION, BILATERIAL (Bilateral )  Patient location during evaluation: PACU Anesthesia Type: Spinal and General Level of consciousness: awake and alert Pain management: pain level controlled Vital Signs Assessment: post-procedure vital signs reviewed and stable Respiratory status: spontaneous breathing, nonlabored ventilation, respiratory function stable and patient connected to nasal cannula oxygen Cardiovascular status: blood pressure returned to baseline and stable Postop Assessment: no apparent nausea or vomiting and patient able to bend at knees Anesthetic complications: no     Last Vitals:  Vitals:   01/18/19 1030 01/18/19 1052  BP: (!) 104/57 (!) 108/57  Pulse: 63 60  Resp: 12 14  Temp:  (!) 36.1 C  SpO2: 95% 94%    Last Pain:  Vitals:   01/18/19 1030  TempSrc:   PainSc: 0-No pain                 Arita Miss

## 2019-01-18 NOTE — Interval H&P Note (Signed)
History and Physical Interval Note:  01/18/2019 8:48 AM  Sarah Rivas  has presented today for surgery, with the diagnosis of BILATERIAL TUBAL LIGATION.  The various methods of treatment have been discussed with the patient and family. After consideration of risks, benefits and other options for treatment, the patient has consented to  Procedure(s): POST PARTUM TUBAL LIGATION, BILATERIAL (Bilateral) as a surgical intervention.  The patient's history has been reviewed, patient examined, no change in status, stable for surgery.  I have reviewed the patient's chart and labs.  Questions were answered to the patient's satisfaction.     [place]

## 2019-01-18 NOTE — Anesthesia Postprocedure Evaluation (Signed)
Anesthesia Post Note  Patient: Sarah Rivas  Procedure(s) Performed: AN AD Burden  Patient location during evaluation: Mother Baby Anesthesia Type: Epidural Level of consciousness: awake and alert Pain management: pain level controlled Vital Signs Assessment: post-procedure vital signs reviewed and stable Respiratory status: spontaneous breathing and respiratory function stable Cardiovascular status: blood pressure returned to baseline and stable Postop Assessment: no headache, no apparent nausea or vomiting, able to ambulate and adequate PO intake Anesthetic complications: no     Last Vitals:  Vitals:   01/17/19 2314 01/18/19 0300  BP: (!) 100/58 99/61  Pulse: 69 71  Resp: 18 18  Temp: 36.9 C 36.4 C  SpO2: 97% 100%    Last Pain:  Vitals:   01/18/19 0300  TempSrc: Oral  PainSc:                  Lia Foyer

## 2019-01-18 NOTE — Transfer of Care (Signed)
Immediate Anesthesia Transfer of Care Note  Patient: Sarah Rivas  Procedure(s) Performed: POST PARTUM TUBAL LIGATION, BILATERIAL (Bilateral )  Patient Location: PACU  Anesthesia Type:General  Level of Consciousness: sedated  Airway & Oxygen Therapy: Patient Spontanous Breathing and Patient connected to face mask oxygen  Post-op Assessment: Report given to RN and Post -op Vital signs reviewed and stable  Post vital signs: Reviewed and stable  Last Vitals:  Vitals Value Taken Time  BP 105/53 01/18/19 1006  Temp 36.1 C 01/18/19 1006  Pulse 61 01/18/19 1006  Resp 10 01/18/19 1006  SpO2 98 % 01/18/19 1006  Vitals shown include unvalidated device data.  Last Pain:  Vitals:   01/18/19 1006  TempSrc:   PainSc: 0-No pain         Complications: No apparent anesthesia complications

## 2019-01-18 NOTE — Progress Notes (Signed)
Pts bladder scanned for 352cc , will notify Dr Gilman Schmidt

## 2019-01-18 NOTE — Progress Notes (Signed)
DC inst reviewed and pt ready to go home.  Verb u/o of DC instructions.  DC to home to car via [facility]

## 2019-01-18 NOTE — Op Note (Signed)
Operative Note  01/18/2019  PRE-OP DIAGNOSIS: Desire for permanent sterilization  POST-OP DIAGNOSIS: same  SURGEON: Adalia Pettis MD  PROCEDURE: Postpartum Bilateral Tubal Ligation   ANESTHESIA: Choice   ESTIMATED BLOOD LOSS: 2 cc  SPECIMENS: Portion of left and right tube  COMPLICATIONS: None  DISPOSITION: PACU - hemodynamically stable.  CONDITION: stable  FINDINGS: Exam under anesthesia revealed small, mobile 20 cm uterus with no masses and bilateral adnexa without masses or fullness.   TECHNIQUE:  The patient was  prepped and draped in usual sterile fashion after adequate anesthesia is obtained in the supine position on the operating room table.  Local anesthesia is injected into the skin just inferior to the umbilicus, followed by a small elliptical incision with a scapel.  Fascia is identified and tented upwards, and an incision is made with Mayo scissors.  Identification of no adherent bowel is made. Retractors are placed and trendelenburg positioning is achieved.    The left Fallopian tube was identified, grasped with the Babcock clamps, lifted to the skin incision and followed out distally to the fimbriae. An avascular midsection of the tube approximately 3-4cm from the cornua was grasped with the babcock clamps and brought into a knuckle at the skin incision. The tube was double ligated with 0-0 Chromic suture and the intervening portion of tube was transected and removed. Excellent hemostasis was noted and the tube was returned to the abdomen. Attention was then turned to the right fallopian tube after confirmation of identification by tracing the tube out to the fimbriae. The same procedure was then performed on the right Fallopian tube. Again, excellent hemostasis was noted at the end of the procedure.  Retractors are removed and fascia closed with a 0-0 Vicryl suture. Irrigation and hemostasis confirmed.  Skin closed with a 4-0 monocryl suture in a subcuticular fashion  followed by skin adhesive.  Pt was taken to recovery room in stable fashion.  All counts correct times 2.  Adrian Prows MD [facility], [facility] 01/18/2019 9:56 AM

## 2019-01-18 NOTE — Anesthesia Preprocedure Evaluation (Signed)
Anesthesia Evaluation  Patient identified by MRN, date of birth, ID band Patient awake    Reviewed: Allergy & Precautions, NPO status , Patient's Chart, lab work & pertinent test results  History of Anesthesia Complications Negative for: history of anesthetic complications  Airway Mallampati: II  TM Distance: >3 FB Neck ROM: Full    Dental no notable dental hx. (+) Teeth Intact   Pulmonary neg pulmonary ROS, neg sleep apnea, neg COPD, Patient abstained from smoking.Not current smoker,    Pulmonary exam normal breath sounds clear to auscultation       Cardiovascular Exercise Tolerance: Good METS(-) hypertension(-) CAD and (-) Past MI negative cardio ROS  (-) dysrhythmias  Rhythm:Regular Rate:Normal - Systolic murmurs    Neuro/Psych  Headaches, PSYCHIATRIC DISORDERS Anxiety Depression negative neurological ROS  negative psych ROS   GI/Hepatic neg GERD  ,(+)     (-) substance abuse  ,   Endo/Other  neg diabetes  Renal/GU negative Renal ROS     Musculoskeletal   Abdominal   Peds  Hematology   Anesthesia Other Findings Past Medical History: No date: Anxiety No date: Depression No date: No pertinent past medical history  Reproductive/Obstetrics                             Anesthesia Physical Anesthesia Plan  ASA: II  Anesthesia Plan: Spinal   Post-op Pain Management:    Induction: Intravenous  PONV Risk Score and Plan: 4 or greater and Ondansetron, Dexamethasone and Midazolam  Airway Management Planned: Natural Airway  Additional Equipment:   Intra-op Plan:   Post-operative Plan:   Informed Consent: I have reviewed the patients History and Physical, chart, labs and discussed the procedure including the risks, benefits and alternatives for the proposed anesthesia with the patient or authorized representative who has indicated his/her understanding and acceptance.        Plan Discussed with: CRNA and Surgeon  Anesthesia Plan Comments: (Discussed R/B/A of neuraxial anesthesia technique with patient: - rare risks of spinal/epidural hematoma, nerve damage, infection - Risk of PDPH - Risk of nausea and vomiting - Risk of conversion to general anesthesia Patient voiced understanding.)        Anesthesia Quick Evaluation

## 2019-01-19 LAB — SURGICAL PATHOLOGY

## 2019-01-25 ENCOUNTER — Telehealth: Payer: Self-pay

## 2019-01-25 NOTE — Telephone Encounter (Signed)
Pt calling; delivered last Tues; is having issues with her milk coming in; is there something that can be called in?  Really wants to breastfeed. [phone] Pt has talked c lactation consultant and has tried everything she suggested. Adv pt there is a rx that we can send in but need lactation consultants adv first.  Pt will call lactation consultant and adv that everything she has tried hasn't worked.  Will let us know.  Also, baby isn't gaining wt like he should be.

## 2019-01-31 ENCOUNTER — Ambulatory Visit: Payer: Medicaid Other | Admitting: Obstetrics & Gynecology

## 2019-02-08 ENCOUNTER — Other Ambulatory Visit: Payer: Self-pay

## 2019-02-08 ENCOUNTER — Ambulatory Visit (INDEPENDENT_AMBULATORY_CARE_PROVIDER_SITE_OTHER): Payer: Medicaid Other | Admitting: Obstetrics & Gynecology

## 2019-02-08 ENCOUNTER — Encounter: Payer: Self-pay | Admitting: Obstetrics & Gynecology

## 2019-02-08 DIAGNOSIS — Z9851 Tubal ligation status: Secondary | ICD-10-CM

## 2019-02-08 NOTE — Progress Notes (Signed)
  Postoperative Follow-up Patient presents post op from post partum BTL for requested sterilization, 3 weeks ago by Dr Gilman Schmidt.  Pt is breast feeding without problems.  She reports a rash all over body at times that burns, does not have clear etiology as to why.  No new meds other than iburpofen.  Did not take hydrocodone.  Patient reports marked improvement in her preop symptoms. Eating a regular diet without difficulty. Pain is controlled with current analgesics. Medications being used: ibuprofen (OTC).  Activity: normal activities of daily living. Patient reports additional symptom's since surgery of min lochia and no s/sx mastitis or PPD.  Objective: BP 120/80   Ht 5\' 3"  (1.6 m)   Wt 141 lb (64 kg)   LMP 04/17/2018 (Exact Date)   BMI 24.98 kg/m  Physical Exam Constitutional:      General: She is not in acute distress.    Appearance: She is well-developed.  Cardiovascular:     Rate and Rhythm: Normal rate.  Pulmonary:     Effort: Pulmonary effort is normal.  Abdominal:     General: There is no distension.     Palpations: Abdomen is soft.     Tenderness: There is no abdominal tenderness.     Comments: Incision Healing Well   Musculoskeletal:        General: Normal range of motion.  Neurological:     Mental Status: She is alert and oriented to person, place, and time.     Cranial Nerves: No cranial nerve deficit.  Skin:    General: Skin is warm and dry.     Assessment: s/p :  tubal ligation progressing well  Plan: Patient has done well after surgery with no apparent complications.  I have discussed the post-operative course to date, and the expected progress moving forward.  The patient understands what complications to be concerned about.  I will see the patient in routine follow up, or sooner if needed.    Activity plan: No restriction.  Pelvic rest. Monitor for recurrence of rash.  Consider dermatology referral Cont breast feeding, monitor for periods once they resume.   Pros and cons of hormonal or other therapies for heavy or irreg periods discussed (has h/o irreg bothersome periods).  A total of 20 minutes were spent face-to-face with the patient as well as preparation, review, communication, and documentation during this encounter.   Hoyt Koch 02/08/2019, 2:11 PM

## 2019-02-22 ENCOUNTER — Encounter: Payer: Self-pay | Admitting: *Deleted

## 2019-02-27 ENCOUNTER — Other Ambulatory Visit: Payer: Self-pay

## 2019-02-27 ENCOUNTER — Encounter: Payer: Self-pay | Admitting: Obstetrics and Gynecology

## 2019-02-27 ENCOUNTER — Ambulatory Visit (INDEPENDENT_AMBULATORY_CARE_PROVIDER_SITE_OTHER): Payer: Medicaid Other | Admitting: Obstetrics and Gynecology

## 2019-02-27 DIAGNOSIS — Z9851 Tubal ligation status: Secondary | ICD-10-CM

## 2019-02-27 DIAGNOSIS — R112 Nausea with vomiting, unspecified: Secondary | ICD-10-CM

## 2019-02-27 MED ORDER — METOCLOPRAMIDE HCL 10 MG PO TABS: 10.0000 mg | ORAL_TABLET | Freq: Three times a day (TID) | ORAL | 3 refills | Status: DC

## 2019-02-27 NOTE — Progress Notes (Signed)
  OBSTETRICS POSTPARTUM CLINIC PROGRESS NOTE  Subjective:     Sarah Rivas is a 34 y.o. [phone] female who presents for a postpartum visit. She is 6 weeks postpartum following a Term pregnancy and delivery by Vaginal, no problems at delivery.  I have fully reviewed the prenatal and intrapartum course. Anesthesia: epidural.  Postpartum course has been complicated by uncomplicated.  Baby is feeding by Breast. She has been struggling with low supply.  She reports that her baby does not seem satisfied with the feeding.  Reports that her baby is feeding every hour.  She tries to pumped milk between feedings and only gets about 1 ounce.  She has been using some warm compresses prior to breast-feeding as well.  She is maintaining adequate hydration.  She does have limited sleep and reports that she is feeding her baby 3-4 times overnight.  Her toddler also wakes up frequently.  Bleeding: patient has not  resumed menses.  Bowel function is normal. Bladder function is normal.  Patient is not sexually active. Contraception method desired is tubal ligation.  Postpartum depression screening: negative. Edinburgh 6.  The following portions of the patient's history were reviewed and updated as appropriate: allergies, current medications, past family history, past medical history, past social history, past surgical history and problem list.  Review of Systems Pertinent items are noted in HPI.  Objective:    BP 108/60   Ht 5\' 3"  (1.6 m)   Wt 138 lb (62.6 kg)   LMP 04/17/2018 (LMP Unknown)   Breastfeeding Yes   BMI 24.45 kg/m   General:  alert and no distress   Breasts:  inspection negative, no nipple discharge or bleeding, no masses or nodularity palpable  Lungs: clear to auscultation bilaterally  Heart:  regular rate and rhythm, S1, S2 normal, no murmur, click, rub or gallop  Abdomen: soft, non-tender; bowel sounds normal; no masses,  no organomegaly.  Well healed umbilical incision                            Assessment:  Post Partum Care visit There are no diagnoses linked to this encounter.  Plan:  See orders and Patient Instructions  Discussed with patient techniques and medications that can be used to increase breast milk supply.  Patient provided with a handout.  Prescribed patient Reglan and discussed regimen for taking this medication.  Discussed over-the-counter fenugreek supplements as well.   Follow up in: 12 months or as needed.   Adrian Prows MD [facility] 02/27/2019 2:02 PM

## 2019-09-05 ENCOUNTER — Emergency Department (HOSPITAL_COMMUNITY): Payer: Medicaid Other

## 2019-09-05 ENCOUNTER — Encounter (HOSPITAL_COMMUNITY): Payer: Self-pay

## 2019-09-05 ENCOUNTER — Emergency Department (HOSPITAL_COMMUNITY)
Admission: EM | Admit: 2019-09-05 | Discharge: 2019-09-05 | Disposition: A | Discharge status: Home / Self Care | Payer: Medicaid Other | Attending: Emergency Medicine | Admitting: Emergency Medicine

## 2019-09-05 DIAGNOSIS — Z79899 Other long term (current) drug therapy: Secondary | ICD-10-CM | POA: Diagnosis not present

## 2019-09-05 DIAGNOSIS — R1031 Right lower quadrant pain: Secondary | ICD-10-CM | POA: Diagnosis present

## 2019-09-05 DIAGNOSIS — N202 Calculus of kidney with calculus of ureter: Secondary | ICD-10-CM | POA: Insufficient documentation

## 2019-09-05 DIAGNOSIS — N200 Calculus of kidney: Secondary | ICD-10-CM

## 2019-09-05 DIAGNOSIS — R1033 Periumbilical pain: Secondary | ICD-10-CM | POA: Diagnosis not present

## 2019-09-05 DIAGNOSIS — R911 Solitary pulmonary nodule: Secondary | ICD-10-CM | POA: Insufficient documentation

## 2019-09-05 DIAGNOSIS — N201 Calculus of ureter: Secondary | ICD-10-CM

## 2019-09-05 LAB — I-STAT BETA HCG BLOOD, ED (MC, WL, AP ONLY): I-stat hCG, quantitative: <5 m[IU]/mL (ref <5)

## 2019-09-05 LAB — CBC WITH DIFFERENTIAL/PLATELET
Abs Immature Granulocytes: 0.03 10*3/uL (ref 0.00–0.07)
Basophils Absolute: 0.1 10*3/uL (ref 0.0–0.1)
Basophils Relative: 1 %
Eosinophils Absolute: 0.1 10*3/uL (ref 0.0–0.5)
Eosinophils Relative: 1 %
HCT: 43.4 % (ref 36.0–46.0)
Hemoglobin: 14.2 g/dL (ref 12.0–15.0)
Immature Granulocytes: 0 %
Lymphocytes Relative: 17 %
Lymphs Abs: 1.4 10*3/uL (ref 0.7–4.0)
MCH: 30.7 pg (ref 26.0–34.0)
MCHC: 32.7 g/dL (ref 30.0–36.0)
MCV: 93.7 fL (ref 80.0–100.0)
Monocytes Absolute: 0.4 10*3/uL (ref 0.1–1.0)
Monocytes Relative: 5 %
Neutro Abs: 6.2 10*3/uL (ref 1.7–7.7)
Neutrophils Relative %: 76 %
Platelets: 215 10*3/uL (ref 150–400)
RBC: 4.63 MIL/uL (ref 3.87–5.11)
RDW: 12.7 % (ref 11.5–15.5)
WBC: 8.1 10*3/uL (ref 4.0–10.5)
nRBC: 0 % (ref 0.0–0.2)

## 2019-09-05 LAB — COMPREHENSIVE METABOLIC PANEL
ALT: 12 U/L (ref 0–44)
AST: 14 U/L — ABNORMAL LOW (ref 15–41)
Albumin: 3.6 g/dL (ref 3.5–5.0)
Alkaline Phosphatase: 52 U/L (ref 38–126)
Anion gap: 7 (ref 5–15)
BUN: 6 mg/dL (ref 6–20)
CO2: 23 mmol/L (ref 22–32)
Calcium: 8.8 mg/dL — ABNORMAL LOW (ref 8.9–10.3)
Chloride: 109 mmol/L (ref 98–111)
Creatinine, Ser: 0.96 mg/dL (ref 0.44–1.00)
GFR calc Af Amer: >60 mL/min (ref >60)
GFR calc non Af Amer: >60 mL/min (ref >60)
Glucose, Bld: 101 mg/dL — ABNORMAL HIGH (ref 70–99)
Potassium: 3.7 mmol/L (ref 3.5–5.1)
Sodium: 139 mmol/L (ref 135–145)
Total Bilirubin: 0.5 mg/dL (ref 0.3–1.2)
Total Protein: 6 g/dL — ABNORMAL LOW (ref 6.5–8.1)

## 2019-09-05 LAB — WET PREP, GENITAL
Clue Cells Wet Prep HPF POC: NONE SEEN
Sperm: NONE SEEN
Trich, Wet Prep: NONE SEEN
Yeast Wet Prep HPF POC: NONE SEEN

## 2019-09-05 LAB — URINALYSIS, ROUTINE W REFLEX MICROSCOPIC
Bilirubin Urine: NEGATIVE
Glucose, UA: NEGATIVE mg/dL
Ketones, ur: NEGATIVE mg/dL
Leukocytes,Ua: NEGATIVE
Nitrite: NEGATIVE
Protein, ur: NEGATIVE mg/dL
Specific Gravity, Urine: 1.002 — ABNORMAL LOW (ref 1.005–1.030)
pH: 7 (ref 5.0–8.0)

## 2019-09-05 LAB — LIPASE, BLOOD: Lipase: 23 U/L (ref 11–51)

## 2019-09-05 MED ORDER — ONDANSETRON HCL 4 MG PO TABS: 4.0000 mg | ORAL_TABLET | Freq: Three times a day (TID) | ORAL | 0 refills | Status: DC | PRN

## 2019-09-05 MED ORDER — IOHEXOL 300 MG/ML  SOLN
100.0000 mL | Freq: Once | INTRAMUSCULAR | Status: AC | PRN
  Administered 2019-09-05: 100 mL via INTRAVENOUS

## 2019-09-05 MED ORDER — ONDANSETRON HCL 4 MG/2ML IJ SOLN
4.0000 mg | Freq: Once | INTRAMUSCULAR | Status: AC
  Administered 2019-09-05: 4 mg via INTRAVENOUS
  Filled 2019-09-05: qty 2

## 2019-09-05 MED ORDER — KETOROLAC TROMETHAMINE 30 MG/ML IJ SOLN
30.0000 mg | Freq: Once | INTRAMUSCULAR | Status: AC
  Administered 2019-09-05: 30 mg via INTRAVENOUS
  Filled 2019-09-05: qty 1

## 2019-09-05 MED ORDER — OXYCODONE HCL 5 MG PO TABS: 5.0000 mg | ORAL_TABLET | ORAL | 0 refills | Status: AC | PRN

## 2019-09-05 MED ORDER — TAMSULOSIN HCL 0.4 MG PO CAPS: 0.4000 mg | ORAL_CAPSULE | Freq: Every day | ORAL | 0 refills | Status: AC

## 2019-09-05 MED ORDER — FENTANYL CITRATE (PF) 100 MCG/2ML IJ SOLN
100.0000 ug | Freq: Once | INTRAMUSCULAR | Status: AC
  Administered 2019-09-05: 100 ug via INTRAVENOUS
  Filled 2019-09-05: qty 2

## 2019-09-05 NOTE — ED Notes (Signed)
Pt transported to CT ?

## 2019-09-05 NOTE — ED Provider Notes (Signed)
[facility]ERGENCY DEPARTMENT Provider Note   CSN: [id number] Arrival date & time: 09/05/19  4128     History Chief Complaint  Patient presents with  . Abdominal Pain    Sarah Rivas is a 34 y.o. female with history of recent uncomplicated vaginal delivery and tubal ligation January 2021 presents to the ED for evaluation of sudden onset right lower abdominal pain that began this morning as she was getting ready for the day.  Reports sudden, severe constant twisting type pain in the bellybutton that radiates to her right lower abdomen and right low back.  Feels like she cannot stop moving due to the pain.  Associated with nausea.  She was given 150 mcg of fentanyl by EMS with minimal relief.  Denies fever, vomiting.  Denies changes in bowel movements.  Denies changes in vaginal discharge, abnormal vaginal bleeding.  Denies dysuria, hematuria, urinary frequency or urgency, decreased urine output.  No other abdominal surgeries.  No history of kidney stones.  She is sexually active with husband but not concerned about any pelvic infections and STDs.  HPI     Past Medical History:  Diagnosis Date  . Anxiety   . Depression   . No pertinent past medical history     Patient Active Problem List   Diagnosis Date Noted  . S/P tubal ligation 02/08/2019  . Postpartum care following vaginal delivery 01/18/2019  . Encounter for tubal ligation 01/18/2019  . Normal labor and delivery 01/17/2019  . Supervision of other normal pregnancy, antepartum 06/10/2018  . URI 04/20/2008  . FIBROCYSTIC BREAST DISEASE 11/28/2007  . ABDOMINAL PAIN, RIGHT LOWER QUADRANT 06/15/2007  . HEARING LOSS 05/06/2007  . CHEST PAIN 05/06/2007  . COMMON MIGRAINE 09/01/2006  . DISORDER, MENSTRUAL NOS 09/01/2006  . ANXIETY 04/19/2006  . ANXIETY, SITUATIONAL 04/19/2006  . DEPRESSION 04/19/2006  . SYNCOPE 04/19/2006    Past Surgical History:  Procedure Laterality Date  . DILATION AND  CURETTAGE OF UTERUS    . TONSILLECTOMY    . TUBAL LIGATION Bilateral 01/18/2019   Procedure: POST PARTUM TUBAL LIGATION, BILATERIAL;  Surgeon: Homero Fellers, MD;  Location: [facility] ORS;  Service: Gynecology;  Laterality: Bilateral;     OB History    Gravida  5   Para  4   Term  4   Preterm  0   AB  1   Living  4     SAB  1   TAB  0   Ectopic  0   Multiple  0   Live Births  4           Family History  Problem Relation Age of Onset  . Prostate cancer Maternal Grandfather 53  . Lung cancer Maternal Grandfather 37  . Anesthesia problems Neg Hx     Social History   Tobacco Use  . Smoking status: Never Smoker  . Smokeless tobacco: Never Used  Vaping Use  . Vaping Use: Never used  Substance Use Topics  . Alcohol use: No  . Drug use: No    Home Medications Prior to Admission medications   Medication Sig Start Date End Date Taking? Authorizing Provider  acetaminophen (TYLENOL) 500 MG tablet Take 2 tablets (1,000 mg total) by mouth every 6 (six) hours as needed for mild pain or moderate pain. Patient not taking: Reported on 02/27/2019 01/18/19   Dalia Heading, CNM  busPIRone (BUSPAR) 7.5 MG tablet TAKE 1 TABLET BY MOUTH TWICE A DAY 06/30/18  Schuman, Christanna R, MD  ibuprofen (ADVIL) 600 MG tablet Take 1 tablet (600 mg total) by mouth every 6 (six) hours. Patient not taking: Reported on 02/27/2019 01/18/19   Dalia Heading, CNM  metoCLOPramide (REGLAN) 10 MG tablet Take 1 tablet (10 mg total) by mouth 3 (three) times daily before meals. 02/27/19   Schuman, Stefanie Libel, MD  ondansetron (ZOFRAN) 4 MG tablet Take 1 tablet (4 mg total) by mouth every 8 (eight) hours as needed for nausea or vomiting. 09/05/19   Kinnie Feil, PA-C  oxyCODONE (OXY IR/ROXICODONE) 5 MG immediate release tablet Take 1 tablet (5 mg total) by mouth every 4 (four) hours as needed for up to 3 days for severe pain. 09/05/19 09/08/19  Kinnie Feil, PA-C  Prenatal Vit-Fe  Fumarate-FA (PRENATAL VITAMIN PLUS LOW IRON) 27-1 MG TABS Take 1 tablet by mouth daily. 05/24/18   [provider]  tamsulosin (FLOMAX) 0.4 MG CAPS capsule Take 1 capsule (0.4 mg total) by mouth daily for 15 days. 09/05/19 09/20/19  Kinnie Feil, PA-C    Allergies    Codeine and Sertraline hcl  Review of Systems   Review of Systems  Gastrointestinal: Positive for abdominal pain and nausea.  All other systems reviewed and are negative.   Physical Exam Updated Vital Signs BP (!) 109/58   Pulse 69   Temp 98.2 F (36.8 C) (Oral)   Resp (!) 23   Ht 5\' 3"  (1.6 m)   Wt 57.6 kg   SpO2 98%   BMI 22.50 kg/m   Physical Exam Vitals and nursing note reviewed.  Constitutional:      Appearance: She is well-developed.     Comments: Appears uncomfortable, moving around in bed, holding her abdomen but nontoxic.  Crying.  HENT:     Head: Normocephalic and atraumatic.     Nose: Nose normal.  Eyes:     Conjunctiva/sclera: Conjunctivae normal.  Cardiovascular:     Rate and Rhythm: Normal rate and regular rhythm.  Pulmonary:     Effort: Pulmonary effort is normal.     Breath sounds: Normal breath sounds.  Abdominal:     General: Bowel sounds are normal.     Palpations: Abdomen is soft.     Tenderness: There is abdominal tenderness in the right lower quadrant and periumbilical area. There is right CVA tenderness.     Comments: TTP periumbilical, right lower quadrant, right mid abdomen with guarding.  Right CVA tenderness.  Abdomen is soft.  No distention, rigidity.  Active bowel sounds to lower quadrants.  Negative Rovsing's.  Genitourinary:    Comments:  Exam performed with EMT at bedside for assistance. External genitalia without lesions.  No groin lymphadenopathy.  Vaginal mucosa and cervix pink without lesions.  Scant likely physiologic clear/white discharge in vaginal vault noted.  No CMT.  Nonpalpable, nontender adnexa.  Perianal skin normal without  lesions. Musculoskeletal:        General: Normal range of motion.     Cervical back: Normal range of motion.  Skin:    General: Skin is warm and dry.     Capillary Refill: Capillary refill takes less than 2 seconds.  Neurological:     Mental Status: She is alert.  Psychiatric:        Behavior: Behavior normal.     ED Results / Procedures / Treatments   Labs (all labs ordered are listed, but only abnormal results are displayed) Labs Reviewed  WET PREP, GENITAL - Abnormal; Notable for  the following components:      Result Value   WBC, Wet Prep HPF POC MODERATE (*)    All other components within normal limits  COMPREHENSIVE METABOLIC PANEL - Abnormal; Notable for the following components:   Glucose, Bld 101 (*)    Calcium 8.8 (*)    Total Protein 6.0 (*)    AST 14 (*)    All other components within normal limits  URINALYSIS, ROUTINE W REFLEX MICROSCOPIC - Abnormal; Notable for the following components:   Color, Urine STRAW (*)    Specific Gravity, Urine 1.002 (*)    Hgb urine dipstick LARGE (*)    Bacteria, UA RARE (*)    All other components within normal limits  CBC WITH DIFFERENTIAL/PLATELET  LIPASE, BLOOD  I-STAT BETA HCG BLOOD, ED ([facility], [facility], [facility] ONLY)  GC/CHLAMYDIA PROBE AMP ([place]) NOT AT [facility]    EKG None  Radiology CT ABDOMEN PELVIS W CONTRAST  Result Date: 09/05/2019 CLINICAL DATA:  Right lower quadrant abdominal pain. EXAM: CT ABDOMEN AND PELVIS WITH CONTRAST TECHNIQUE: Multidetector CT imaging of the abdomen and pelvis was performed using the standard protocol following bolus administration of intravenous contrast. CONTRAST:  17mL OMNIPAQUE IOHEXOL 300 MG/ML  SOLN COMPARISON:  09/06/2008 FINDINGS: Lower chest: 3 mm subpleural nodule in the lingula (image 8/series 5) is probably a subpleural lymph node. Hepatobiliary: No suspicious focal abnormality within the liver parenchyma. There is no evidence for gallstones, gallbladder wall thickening, or pericholecystic  fluid. No intrahepatic or extrahepatic biliary dilation. Pancreas: No focal mass lesion. No dilatation of the main duct. No intraparenchymal cyst. No peripancreatic edema. Spleen: No splenomegaly. No focal mass lesion. Adrenals/Urinary Tract: No adrenal nodule or mass. 2 mm nonobstructing stone identified lower pole right kidney. No substantial secondary change in the right kidney or ureter although there is some subtle right periureteric edema with urothelial enhancement in the distal right ureter. A 3 x 3 x 3 mm distal right ureteral stone is identified at the UVJ (axial image 74/3 and coronal 44/6. MIP reconstruction show the distal ureteral enhancement leading down to the distal stone. Left kidney and ureter unremarkable. The urinary bladder appears normal for the degree of distention. Stomach/Bowel: Stomach is unremarkable. No gastric wall thickening. No evidence of outlet obstruction. Duodenum is normally positioned as is the ligament of Treitz. No small bowel wall thickening. No small bowel dilatation. The terminal ileum is normal. The appendix is normal. No gross colonic mass. No colonic wall thickening. Vascular/Lymphatic: No abdominal aortic aneurysm. There is no gastrohepatic or hepatoduodenal ligament lymphadenopathy. No retroperitoneal or mesenteric lymphadenopathy. No pelvic sidewall lymphadenopathy. Reproductive: The uterus is unremarkable. There is no adnexal mass. Dominant follicle noted left ovary. Other: No intraperitoneal free fluid. Musculoskeletal: No worrisome lytic or sclerotic osseous abnormality. IMPRESSION: 1. 3 x 3 x 3 mm distal right ureteral stone at the UVJ without substantial hydroureteronephrosis. The subtle right periureteric edema and associated enhancement of the distal ureteral urothelium is compatible with the presence of the stone. 2. 2 mm nonobstructing stone lower pole right kidney. 3. Normal appendix. 4. 3 mm subpleural nodule in the lingula. No follow-up needed if patient  is low-risk. Non-contrast chest CT can be considered in 12 months if patient is high-risk. This recommendation follows the consensus statement: Guidelines for Management of Incidental Pulmonary Nodules Detected on CT Images: From the Fleischner Society 2017; Radiology 2017; 284:228-243. Electronically Signed   By: Misty Stanley M.D.   On: 09/05/2019 12:29    Procedures Procedures (  including critical care time)  Medications Ordered in ED Medications  ketorolac (TORADOL) 30 MG/ML injection 30 mg (has no administration in time range)  fentaNYL (SUBLIMAZE) injection 100 mcg (100 mcg Intravenous Given 09/05/19 1010)  ondansetron (ZOFRAN) injection 4 mg (4 mg Intravenous Given 09/05/19 1010)  iohexol (OMNIPAQUE) 300 MG/ML solution 100 mL (100 mLs Intravenous Contrast Given 09/05/19 1203)    ED Course  I have reviewed the triage vital signs and the nursing notes.  Pertinent labs & imaging results that were available during my care of the patient were reviewed by me and considered in my medical decision making (see chart for details).  Clinical Course as of Sep 04 1253  Tue Sep 05, 2019  1243 IMPRESSION: 1. 3 x 3 x 3 mm distal right ureteral stone at the UVJ without substantial hydroureteronephrosis. The subtle right periureteric edema and associated enhancement of the distal ureteral urothelium is compatible with the presence of the stone. 2. 2 mm nonobstructing stone lower pole right kidney. 3. Normal appendix. 4. 3 mm subpleural nodule in the lingula. No follow-up needed if patient is low-risk. Non-contrast chest CT can be considered in 12 months if patient is high-risk. This recommendation follows the consensus statement: Guidelines for Management of Incidental Pulmonary Nodules Detected on CT Images: From the Fleischner Society 2017; Radiology 2017; 284:228-243.    CT ABDOMEN PELVIS W CONTRAST [CG]  1243 Bacteria, UA(!): RARE [CG]  1243 Creatinine: 0.96 [CG]  1243 WBC: 8.1 [CG]   1243 WBC, Wet Prep HPF POC(!): MODERATE [CG]    Clinical Course User Index [CG] Kinnie Feil, PA-C   MDM Rules/Calculators/A&P                           This patient complains of sudden onset periumbilical RLQ abdominal pain, nausea. No other symptoms.   I obtained additional history from triage, nursing notes and review of medical chart.  Previous medical records available, nursing notes reviewed to obtain more history and assist with MDM  Chief complain involves an extensive number of treatment options and is a complaint that carries with it a high risk of complications and morbidity and mortality.    The differential diagnosis includes renal colic or ureteral stone, ovarian torsion, appendicitis.    I have ordered lab work and imaging studies including labs, urinalysis.  Have ordered pelvic exam, vaginal swabs.    I have personally visualized and interpreted labs and imaging.    ER work up reveals large hemoglobin in urine but no RBCs. No signs of infection in urine. Creatinine normal. WBC normal.   I ordered medications including fentanyl, zofran.    Pelvic exam unremarkable. Lower suspicion for pelvic process.  Ordered CTAP w contrast. High suspicion for renal stone.    I have ordered continuous cardiac monitoring, pulse ox.  Will plan for serial re-examinations. Close monitoring.   1255: CTAP confirms 3 x 3 x 3 mm right ureteral stone at the UVJ.  No other complication features.  Incidental small pulmonary nodule, patient made aware. Patient re-evaluated and reports improvement in pain 5/10.  Will discharge with flomax, oxycodone, ibuprofen, oral hydration, urology follow up as needed.  She is no longer breast feeding. Return to ER precautions given.  She is comfortable with this plan.       Final Clinical Impression(s) / ED Diagnoses Final diagnoses:  Right ureteral stone  Right kidney stone  Lung nodule    Rx / DC  Orders ED Discharge Orders         Ordered     ondansetron (ZOFRAN) 4 MG tablet  Every 8 hours PRN        09/05/19 1248    oxyCODONE (OXY IR/ROXICODONE) 5 MG immediate release tablet  Every 4 hours PRN        09/05/19 1248    tamsulosin (FLOMAX) 0.4 MG CAPS capsule  Daily        09/05/19 1248           Arlean Hopping 09/05/19 1255    Daleen Bo, MD 09/05/19 2003

## 2019-09-05 NOTE — Discharge Instructions (Addendum)
You have a 3 x 3 x 3 mm stone in your right ureter.    Please take 600 mg ibuprofen every 6 hours for mild or moderate pain.  Take oxycodone 5 mg eery 4 hours for severe or break through pain.  Oxycodone may cause constipation, use stool softener as needed.  Take tamsulosin to help dilate your ureter. Ondansetron for nausea as needed.  Drink plenty of water  CT incidentally found a small lung nodule.  Please discuss this with your primary care doctor.  If someone is low risk for cancer, you will not need any further work up.  If you are high risk for cancer, you may need repeat CT scan in a few months for surveillance. No further treatment needed for this finding here in ER.

## 2019-09-05 NOTE — ED Notes (Signed)
Pt d/c home per MD order. Discharge summary reviewed with pt- pt verbalizes understanding. Ambulatory off unit. No s/s of acute distress noted. Reports friend is her discharge ride home.

## 2019-09-05 NOTE — ED Triage Notes (Signed)
Pt to ED via EMS from home c/o RLQ pain that started suddenly this morning, reports she was doing her normal morning routine, getting her kids ready and pain started. 150 Fentanyl given by EMS - pt has had minimal relief. Last VS: 97.4 , 110/88, 82, 98%- No medical history.

## 2019-09-06 LAB — GC/CHLAMYDIA PROBE AMP (~~LOC~~) NOT AT ARMC
Chlamydia: NEGATIVE
Comment: NEGATIVE
Comment: NORMAL
Neisseria Gonorrhea: NEGATIVE

## 2020-12-29 IMAGING — CT CT ABD-PELV W/ CM
2 of 4 series · 15 of 46 positions shown, 17 images · IV contrast (omnipaque)
Comparison: 09/06/2008

CLINICAL DATA: Right lower quadrant abdominal pain.

EXAM:
CT ABDOMEN AND PELVIS WITH CONTRAST
TECHNIQUE: Multidetector CT imaging of the abdomen and pelvis was performed
using the standard protocol following bolus administration of
intravenous contrast.
CONTRAST:  100mL OMNIPAQUE IOHEXOL 300 MG/ML  SOLN

[Series 3: abdomen 5.0 · axial · 0.63mm/px · z∈[+739,+1124]mm · 12 of 87 slices shown, 14 images]
[im 5/87  soft-tissue]
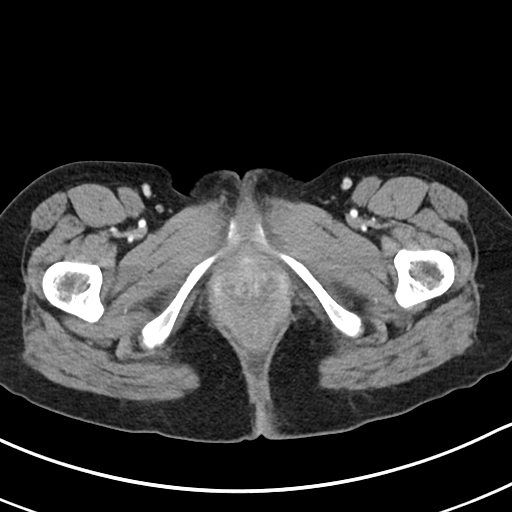
[im 5/87  bone]
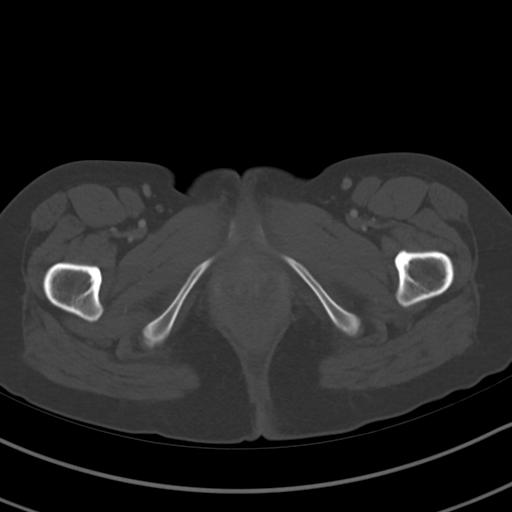
[im 13/87  soft-tissue]
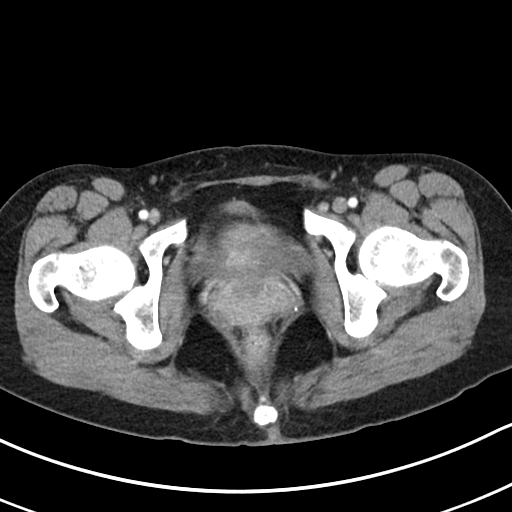
[im 21/87  soft-tissue]
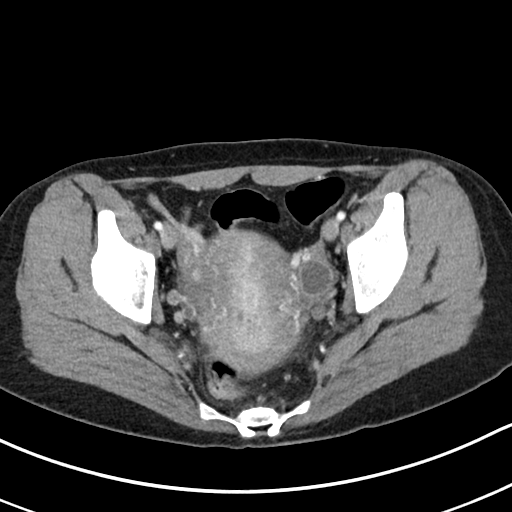
[im 25/87  soft-tissue]
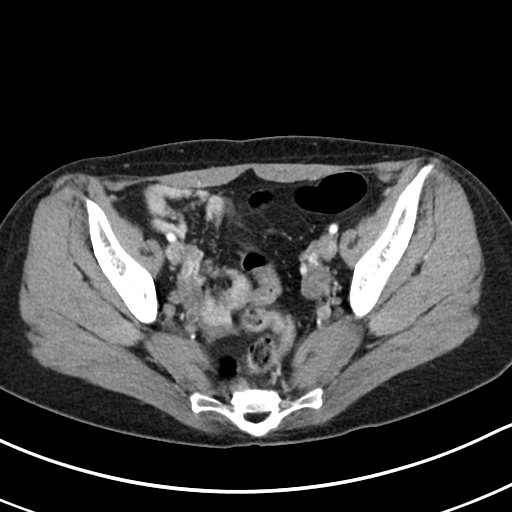
[im 33/87  soft-tissue]
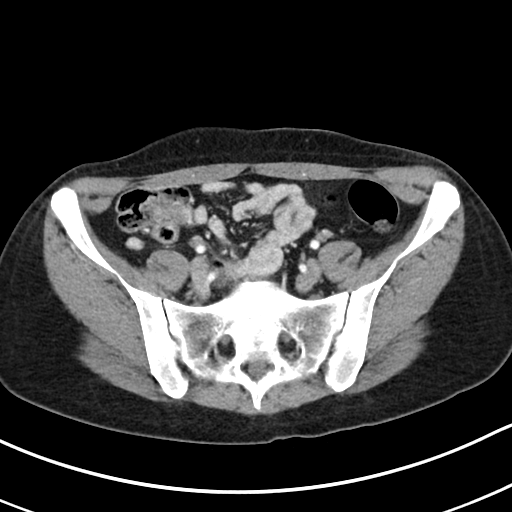
[im 41/87  soft-tissue]
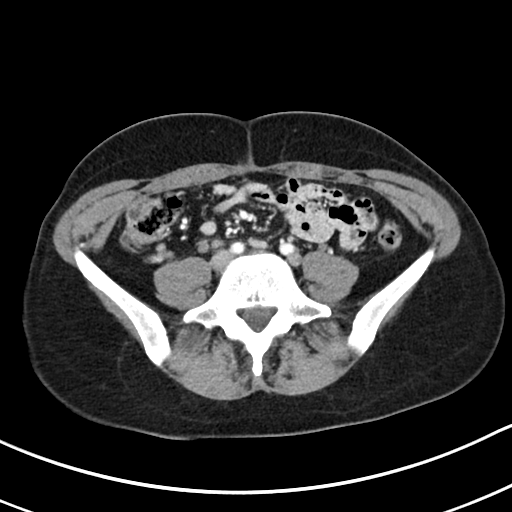
[im 46/87  soft-tissue]
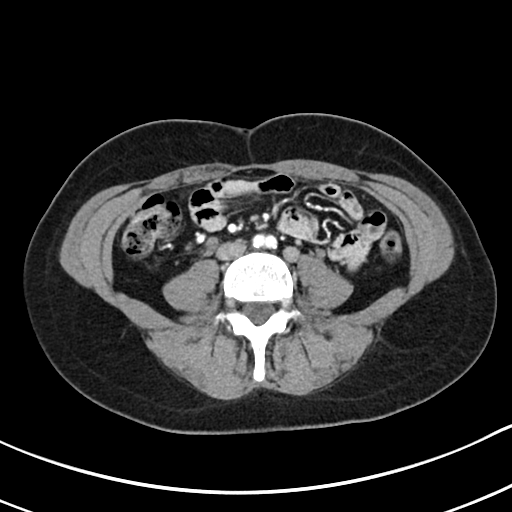
[im 54/87  soft-tissue]
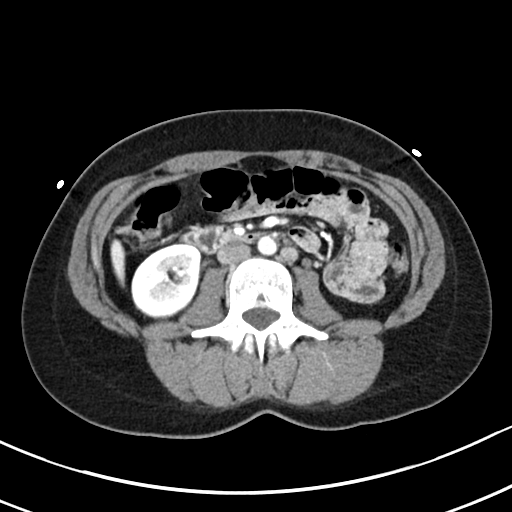
[im 62/87  soft-tissue]
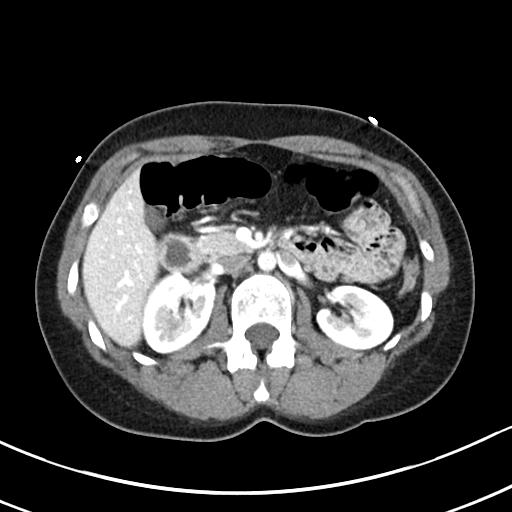
[im 62/87  bone]
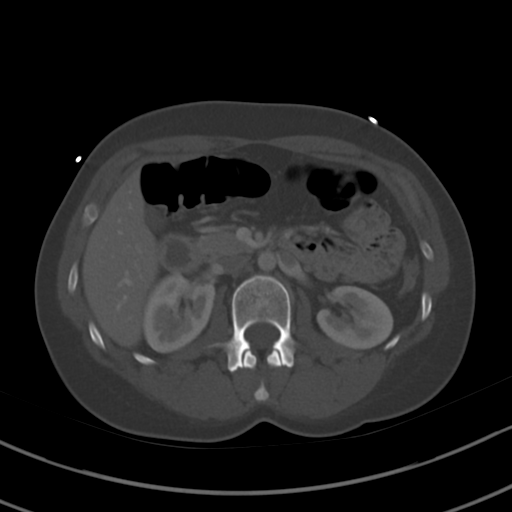
[im 66/87  soft-tissue]
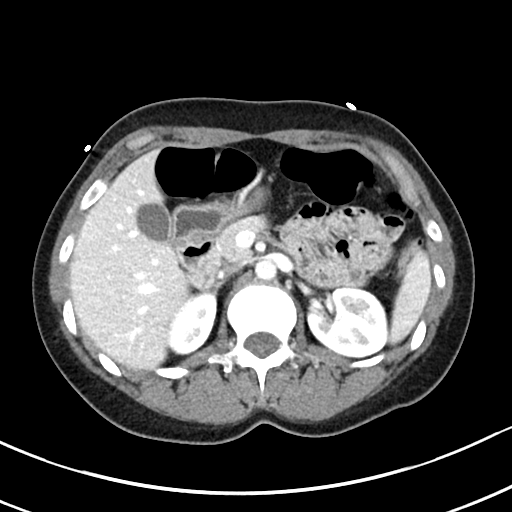
[im 74/87  soft-tissue]
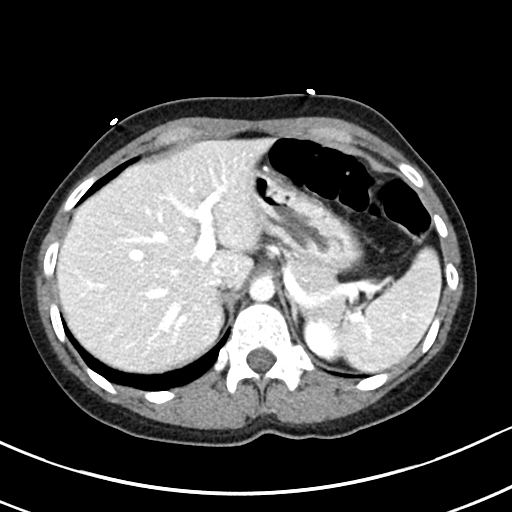
[im 82/87  soft-tissue]
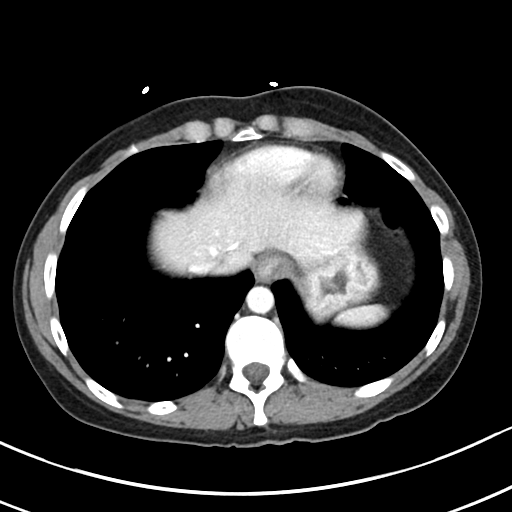

[Series 6: abdomen 3.0 mpr cor · coronal · 0.62mm/px · 3 of 79 slices shown]
[im 27/79  soft-tissue]
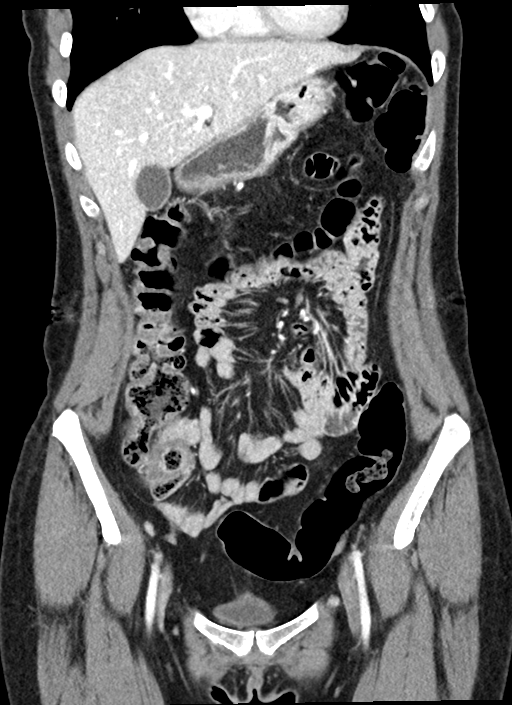
[im 35/79  soft-tissue]
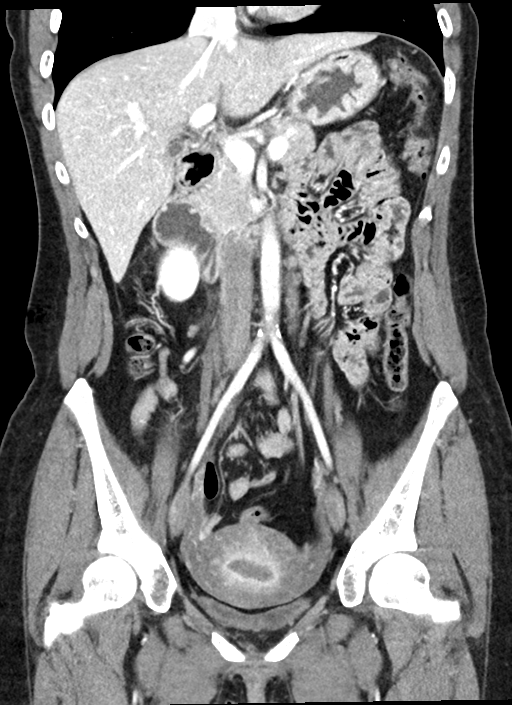
[im 44/79  soft-tissue]
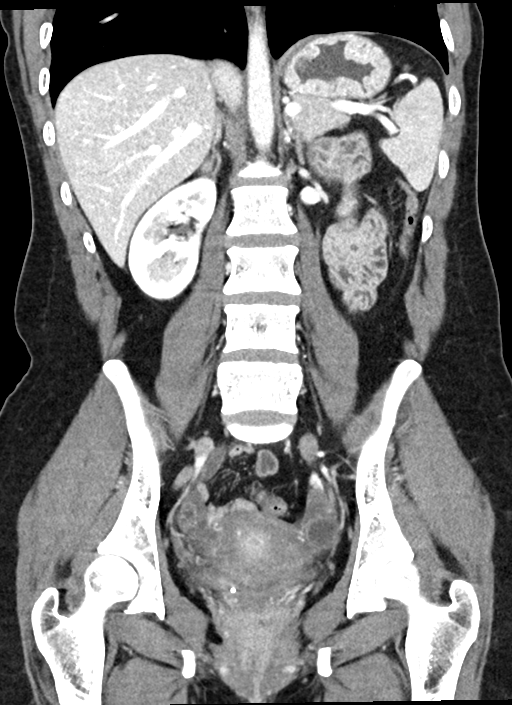

[15 of 46 positions shown; findings below may reference images not displayed]

FINDINGS: Lower chest: 3 mm subpleural nodule in the lingula (image 8/series
5) is probably a subpleural lymph node.

Hepatobiliary: No suspicious focal abnormality within the liver
parenchyma. There is no evidence for gallstones, gallbladder wall
thickening, or pericholecystic fluid. No intrahepatic or
extrahepatic biliary dilation.

Pancreas: No focal mass lesion. No dilatation of the main duct. No
intraparenchymal cyst. No peripancreatic edema.

Spleen: No splenomegaly. No focal mass lesion.

Adrenals/Urinary Tract: No adrenal nodule or mass. 2 mm
nonobstructing stone identified lower pole right kidney. No
substantial secondary change in the right kidney or ureter although
there is some subtle right periureteric edema with urothelial
enhancement in the distal right ureter. A 3 x 3 x 3 mm distal right
ureteral stone is identified at the UVJ (axial image 74/3 and
coronal 44/6. MIP reconstruction show the distal ureteral
enhancement leading down to the distal stone. Left kidney and ureter
unremarkable. The urinary bladder appears normal for the degree of
distention.

Stomach/Bowel: Stomach is unremarkable. No gastric wall thickening.
No evidence of outlet obstruction. Duodenum is normally positioned
as is the ligament of Treitz. No small bowel wall thickening. No
small bowel dilatation. The terminal ileum is normal. The appendix
is normal. No gross colonic mass. No colonic wall thickening.

Vascular/Lymphatic: No abdominal aortic aneurysm. There is no
gastrohepatic or hepatoduodenal ligament lymphadenopathy. No
retroperitoneal or mesenteric lymphadenopathy. No pelvic sidewall
lymphadenopathy.

Reproductive: The uterus is unremarkable. There is no adnexal mass.
Dominant follicle noted left ovary.

Other: No intraperitoneal free fluid.

Musculoskeletal: No worrisome lytic or sclerotic osseous
abnormality.
IMPRESSION: 1. 3 x 3 x 3 mm distal right ureteral stone at the UVJ without
substantial hydroureteronephrosis. The subtle right periureteric
edema and associated enhancement of the distal ureteral urothelium
is compatible with the presence of the stone.
2. 2 mm nonobstructing stone lower pole right kidney.
3. Normal appendix.
4. 3 mm subpleural nodule in the lingula. No follow-up needed if
patient is low-risk. Non-contrast chest CT can be considered in 12
months if patient is high-risk. This recommendation follows the
consensus statement: Guidelines for Management of Incidental
Pulmonary Nodules Detected on CT Images: From the [HOSPITAL]

## 2021-08-23 ENCOUNTER — Other Ambulatory Visit: Payer: Self-pay

## 2021-08-23 ENCOUNTER — Emergency Department: Payer: Medicaid Other

## 2021-08-23 ENCOUNTER — Emergency Department
Admission: EM | Admit: 2021-08-23 | Discharge: 2021-08-23 | Disposition: A | Discharge status: Home / Self Care | Payer: Medicaid Other | Attending: Student in an Organized Health Care Education/Training Program | Admitting: Student in an Organized Health Care Education/Training Program

## 2021-08-23 ENCOUNTER — Encounter: Payer: Self-pay | Admitting: Emergency Medicine

## 2021-08-23 DIAGNOSIS — R197 Diarrhea, unspecified: Secondary | ICD-10-CM | POA: Insufficient documentation

## 2021-08-23 DIAGNOSIS — R1013 Epigastric pain: Secondary | ICD-10-CM | POA: Insufficient documentation

## 2021-08-23 DIAGNOSIS — R531 Weakness: Secondary | ICD-10-CM | POA: Insufficient documentation

## 2021-08-23 DIAGNOSIS — R112 Nausea with vomiting, unspecified: Secondary | ICD-10-CM | POA: Diagnosis present

## 2021-08-23 DIAGNOSIS — R5383 Other fatigue: Secondary | ICD-10-CM | POA: Diagnosis not present

## 2021-08-23 DIAGNOSIS — R110 Nausea: Secondary | ICD-10-CM

## 2021-08-23 LAB — COMPREHENSIVE METABOLIC PANEL
ALT: 11 U/L (ref 0–44)
AST: 16 U/L (ref 15–41)
Albumin: 3.9 g/dL (ref 3.5–5.0)
Alkaline Phosphatase: 51 U/L (ref 38–126)
Anion gap: 7 (ref 5–15)
BUN: 11 mg/dL (ref 6–20)
CO2: 22 mmol/L (ref 22–32)
Calcium: 8.9 mg/dL (ref 8.9–10.3)
Chloride: 109 mmol/L (ref 98–111)
Creatinine, Ser: 1.04 mg/dL — ABNORMAL HIGH (ref 0.44–1.00)
GFR, Estimated: >60 mL/min (ref >60)
Glucose, Bld: 109 mg/dL — ABNORMAL HIGH (ref 70–99)
Potassium: 3.7 mmol/L (ref 3.5–5.1)
Sodium: 138 mmol/L (ref 135–145)
Total Bilirubin: 0.8 mg/dL (ref 0.3–1.2)
Total Protein: 6.7 g/dL (ref 6.5–8.1)

## 2021-08-23 LAB — CBC WITH DIFFERENTIAL/PLATELET
Abs Immature Granulocytes: 0.01 10*3/uL (ref 0.00–0.07)
Basophils Absolute: 0.1 10*3/uL (ref 0.0–0.1)
Basophils Relative: 1 %
Eosinophils Absolute: 0 10*3/uL (ref 0.0–0.5)
Eosinophils Relative: 0 %
HCT: 43.5 % (ref 36.0–46.0)
Hemoglobin: 15 g/dL (ref 12.0–15.0)
Immature Granulocytes: 0 %
Lymphocytes Relative: 20 %
Lymphs Abs: 1.3 10*3/uL (ref 0.7–4.0)
MCH: 32 pg (ref 26.0–34.0)
MCHC: 34.5 g/dL (ref 30.0–36.0)
MCV: 92.8 fL (ref 80.0–100.0)
Monocytes Absolute: 0.4 10*3/uL (ref 0.1–1.0)
Monocytes Relative: 6 %
Neutro Abs: 4.8 10*3/uL (ref 1.7–7.7)
Neutrophils Relative %: 73 %
Platelets: 258 10*3/uL (ref 150–400)
RBC: 4.69 MIL/uL (ref 3.87–5.11)
RDW: 12.2 % (ref 11.5–15.5)
WBC: 6.6 10*3/uL (ref 4.0–10.5)
nRBC: 0 % (ref 0.0–0.2)

## 2021-08-23 LAB — URINALYSIS, ROUTINE W REFLEX MICROSCOPIC
Bilirubin Urine: NEGATIVE
Glucose, UA: NEGATIVE mg/dL
Ketones, ur: 20 mg/dL — AB
Leukocytes,Ua: NEGATIVE
Nitrite: NEGATIVE
Protein, ur: NEGATIVE mg/dL
Specific Gravity, Urine: 1.017 (ref 1.005–1.030)
pH: 7 (ref 5.0–8.0)

## 2021-08-23 LAB — TROPONIN I (HIGH SENSITIVITY): Troponin I (High Sensitivity): 3 ng/L (ref <18)

## 2021-08-23 LAB — POC URINE PREG, ED: Preg Test, Ur: NEGATIVE

## 2021-08-23 LAB — LIPASE, BLOOD: Lipase: 25 U/L (ref 11–51)

## 2021-08-23 MED ORDER — PROCHLORPERAZINE EDISYLATE 10 MG/2ML IJ SOLN
10.0000 mg | Freq: Once | INTRAMUSCULAR | Status: AC
Start: 2021-08-23 — End: 2021-08-23
  Administered 2021-08-23: 10 mg via INTRAVENOUS
  Filled 2021-08-23: qty 2

## 2021-08-23 MED ORDER — ONDANSETRON 4 MG PO TBDP: 4.0000 mg | ORAL_TABLET | Freq: Three times a day (TID) | ORAL | 0 refills | Status: DC | PRN

## 2021-08-23 MED ORDER — SODIUM CHLORIDE 0.9 % IV BOLUS
1000.0000 mL | Freq: Once | INTRAVENOUS | Status: AC
  Administered 2021-08-23: 1000 mL via INTRAVENOUS

## 2021-08-23 MED ORDER — DIPHENHYDRAMINE HCL 50 MG/ML IJ SOLN
12.5000 mg | Freq: Once | INTRAMUSCULAR | Status: AC
  Administered 2021-08-23: 12.5 mg via INTRAVENOUS
  Filled 2021-08-23: qty 1

## 2021-08-23 MED ORDER — ALUM & MAG HYDROXIDE-SIMETH 200-200-20 MG/5ML PO SUSP
30.0000 mL | Freq: Once | ORAL | Status: AC
  Administered 2021-08-23: 30 mL via ORAL
  Filled 2021-08-23: qty 30

## 2021-08-23 NOTE — Discharge Instructions (Addendum)

## 2021-08-23 NOTE — ED Provider Notes (Signed)
[facility] Provider Note    Event Date/Time   First MD Initiated Contact with Patient 08/23/21 1748     (approximate)   History   Abdominal Pain   HPI  Sarah Rivas is a 36 y.o. female [name] to the ER for evaluation nausea vomiting diarrhea since yesterday.  Having generalized weakness and fatigue.  Denies any lower abdominal pain no pain rating to her back.  She is having some chest discomfort no pain with deep inspiration.  Has not been able to keep much food or fluids down denies any dysuria.  No discharge.  No bloody diarrhea.  No measured fevers but has had some chills.  States the pain is epigastric and radiates into her chest.     Physical Exam   Triage Vital Signs: ED Triage Vitals  Enc Vitals Group     BP 08/23/21 1733 (!) 115/91     Pulse Rate 08/23/21 1733 72     Resp 08/23/21 1733 18     Temp 08/23/21 1735 97.7 F (36.5 C)     Temp Source 08/23/21 1735 Oral     SpO2 08/23/21 1732 100 %     Weight 08/23/21 1735 125 lb (56.7 kg)     Height 08/23/21 1735 '5\' 3"'$  (1.6 m)     Head Circumference --      Peak Flow --      Pain Score 08/23/21 1734 5     Pain Loc --      Pain Edu? --      Excl. in [place]? --     Most recent vital signs: Vitals:   08/23/21 2000 08/23/21 2130  BP: 94/66 111/77  Pulse: (!) 42 (!) 51  Resp: 14 16  Temp:    SpO2: 98% 100%     Constitutional: Alert  Eyes: Conjunctivae are normal.  Head: Atraumatic. Nose: No congestion/rhinnorhea. Mouth/Throat: Mucous membranes are moist.   Neck: Painless ROM.  Cardiovascular:   Good peripheral circulation. Respiratory: Normal respiratory effort.  No retractions.  Gastrointestinal: Soft and nontender in all four quadrants Musculoskeletal:  no deformity Neurologic:  MAE spontaneously. No gross focal neurologic deficits are appreciated.  Skin:  Skin is warm, dry and intact. No rash noted. Psychiatric: Mood and affect are normal. Speech and behavior are  normal.    ED Results / Procedures / Treatments   Labs (all labs ordered are listed, but only abnormal results are displayed) Labs Reviewed  COMPREHENSIVE METABOLIC PANEL - Abnormal; Notable for the following components:      Result Value   Glucose, Bld 109 (*)    Creatinine, Ser 1.04 (*)    All other components within normal limits  URINALYSIS, ROUTINE W REFLEX MICROSCOPIC - Abnormal; Notable for the following components:   Color, Urine YELLOW (*)    APPearance CLOUDY (*)    Hgb urine dipstick MODERATE (*)    Ketones, ur 20 (*)    Bacteria, UA RARE (*)    All other components within normal limits  CBC WITH DIFFERENTIAL/PLATELET  LIPASE, BLOOD  POC URINE PREG, ED  TROPONIN I (HIGH SENSITIVITY)  TROPONIN I (HIGH SENSITIVITY)     EKG  ED ECG REPORT I, Merlyn Lot, the attending physician, personally viewed and interpreted this ECG.   Date: 08/23/2021  EKG Time: 17:38  Rate: 60  Rhythm: sinus  Axis: normal  Intervals:normal qt  ST&T Change: motion artifact, no stemi, no depression    RADIOLOGY Please see ED Course  for my review and interpretation.  I personally reviewed all radiographic images ordered to evaluate for the above acute complaints and reviewed radiology reports and findings.  These findings were personally discussed with the patient.  Please see medical record for radiology report.    PROCEDURES:  Critical Care performed:   Procedures   MEDICATIONS ORDERED IN ED: Medications  prochlorperazine (COMPAZINE) injection 10 mg (10 mg Intravenous Given 08/23/21 1816)  diphenhydrAMINE (BENADRYL) injection 12.5 mg (12.5 mg Intravenous Given 08/23/21 1816)  sodium chloride 0.9 % bolus 1,000 mL (0 mLs Intravenous Stopped 08/23/21 1923)  sodium chloride 0.9 % bolus 1,000 mL (0 mLs Intravenous Stopped 08/23/21 2145)  alum & mag hydroxide-simeth (MAALOX/MYLANTA) 200-200-20 MG/5ML suspension 30 mL (30 mLs Oral Given 08/23/21 2008)     IMPRESSION / MDM /  ASSESSMENT AND PLAN / ED COURSE  I reviewed the triage vital signs and the nursing notes.                              Differential diagnosis includes, but is not limited to, gastritis, enteritis, dehydration, electrolyte abnormality, ACS, PE, pneumonia, viral illness, foodborne illness  Patient presented to the ER for evaluation of symptoms as described above.  This presenting complaint could reflect a potentially life-threatening illness therefore the patient will be placed on continuous pulse oximetry and telemetry for monitoring.  Laboratory evaluation will be sent to evaluate for the above complaints.   EKG is nonischemic.  We will give IV fluids as well as antiemetic.  Her abdominal exam is soft and benign.  Do not feel that this is consistent with acute appendicitis obstruction   Clinical Course as of 08/23/21 2211  Sat Aug 23, 2021  1836 Chest x-ray on my review and interpretation without any evidence of pneumothorax. [PR]  2033 Feeling significantly improved after IV hydration.  She appears clinically well perfused in no acute distress.  Repeat exam reassuring and nontender.  Tolerating PO. Does appear stable and appropriate for outpatient follow up.  [PR]    Clinical Course User Index [PR] Merlyn Lot, MD     FINAL CLINICAL IMPRESSION(S) / ED DIAGNOSES   Final diagnoses:  Nausea  Epigastric pain     Rx / DC Orders   ED Discharge Orders          Ordered    ondansetron (ZOFRAN-ODT) 4 MG disintegrating tablet  Every 8 hours PRN        08/23/21 2109             Note:  This document was prepared using Dragon voice recognition software and may include unintentional dictation errors.    Merlyn Lot, MD 08/23/21 2212

## 2021-08-23 NOTE — ED Triage Notes (Signed)
Per [facility] pt coming from home c/o N/V/D since yesterday. Today c/o generalized weakness. EMS initiated IV with LR infusion however QT was prolonged so held off on zofran. Patient hyperventilating in triage. 20G LAC.

## 2022-06-15 ENCOUNTER — Other Ambulatory Visit: Payer: Self-pay | Admitting: Orthopedic Surgery

## 2022-06-15 DIAGNOSIS — M67431 Ganglion, right wrist: Secondary | ICD-10-CM

## 2022-06-15 DIAGNOSIS — G5600 Carpal tunnel syndrome, unspecified upper limb: Secondary | ICD-10-CM

## 2022-06-16 ENCOUNTER — Encounter: Payer: Self-pay | Admitting: Orthopedic Surgery

## 2022-06-24 ENCOUNTER — Ambulatory Visit
Admission: RE | Admit: 2022-06-24 | Discharge: 2022-06-24 | Disposition: A | Discharge status: Home / Self Care | Payer: Medicaid Other | Source: Ambulatory Visit | Attending: Orthopedic Surgery | Admitting: Orthopedic Surgery

## 2022-06-24 DIAGNOSIS — G5600 Carpal tunnel syndrome, unspecified upper limb: Secondary | ICD-10-CM

## 2022-06-24 DIAGNOSIS — M67431 Ganglion, right wrist: Secondary | ICD-10-CM

## 2022-07-03 ENCOUNTER — Other Ambulatory Visit: Payer: Self-pay | Admitting: Orthopedic Surgery

## 2022-07-16 ENCOUNTER — Encounter: Payer: Self-pay | Admitting: Orthopedic Surgery

## 2022-07-17 NOTE — Anesthesia Preprocedure Evaluation (Addendum)
Anesthesia Evaluation  Patient identified by MRN, date of birth, ID band Patient awake    Reviewed: Allergy & Precautions, NPO status , Patient's Chart, lab work & pertinent test results  History of Anesthesia Complications Negative for: history of anesthetic complications  Airway Mallampati: I   Neck ROM: Full    Dental  (+) Missing, Chipped   Pulmonary Current Smoker (1/2 ppd)Patient did not abstain from smoking.   Pulmonary exam normal breath sounds clear to auscultation       Cardiovascular Exercise Tolerance: Good Normal cardiovascular exam Rhythm:Regular Rate:Normal  ECG 08/23/21:  Sinus bradycardia Possible Inferior infarct , age undetermined Cannot rule out Anterior infarct , age undetermined Abnormal ECG When compared with ECG of 22-Mar-2007 12:34, No significant change was found   Neuro/Psych  Headaches PSYCHIATRIC DISORDERS Anxiety Depression       GI/Hepatic negative GI ROS,,,  Endo/Other  negative endocrine ROS    Renal/GU negative Renal ROS     Musculoskeletal   Abdominal   Peds  Hematology negative hematology ROS (+)   Anesthesia Other Findings   Reproductive/Obstetrics Endometriosis                              Anesthesia Physical Anesthesia Plan  ASA: 2  Anesthesia Plan: General   Post-op Pain Management:    Induction: Intravenous  PONV Risk Score and Plan: 2 and Propofol infusion, TIVA, Treatment may vary due to age or medical condition and Ondansetron  Airway Management Planned: Natural Airway  Additional Equipment:   Intra-op Plan:   Post-operative Plan:   Informed Consent: I have reviewed the patients History and Physical, chart, labs and discussed the procedure including the risks, benefits and alternatives for the proposed anesthesia with the patient or authorized representative who has indicated his/her understanding and acceptance.        Plan Discussed with: CRNA  Anesthesia Plan Comments: (LMA/GETA backup discussed.  Patient consented for risks of anesthesia including but not limited to:  - adverse reactions to medications - damage to eyes, teeth, lips or other oral mucosa - nerve damage due to positioning  - sore throat or hoarseness - damage to heart, brain, nerves, lungs, other parts of body or loss of life  Informed patient about role of CRNA in peri- and intra-operative care.  Patient voiced understanding.)        Anesthesia Quick Evaluation

## 2022-07-28 ENCOUNTER — Other Ambulatory Visit: Payer: Self-pay

## 2022-07-28 ENCOUNTER — Encounter: Payer: Self-pay | Admitting: Orthopedic Surgery

## 2022-07-28 ENCOUNTER — Ambulatory Visit: Payer: Medicaid Other | Admitting: Anesthesiology

## 2022-07-28 ENCOUNTER — Ambulatory Visit
Admission: RE | Admit: 2022-07-28 | Discharge: 2022-07-28 | Disposition: A | Discharge status: Home / Self Care | Payer: Medicaid Other | Attending: Orthopedic Surgery | Admitting: Orthopedic Surgery

## 2022-07-28 ENCOUNTER — Encounter
Admission: RE | Disposition: A | Discharge status: Home / Self Care | Payer: Self-pay | Source: Home / Self Care | Attending: Orthopedic Surgery

## 2022-07-28 DIAGNOSIS — G5601 Carpal tunnel syndrome, right upper limb: Secondary | ICD-10-CM | POA: Diagnosis not present

## 2022-07-28 DIAGNOSIS — M67431 Ganglion, right wrist: Secondary | ICD-10-CM | POA: Insufficient documentation

## 2022-07-28 DIAGNOSIS — F17210 Nicotine dependence, cigarettes, uncomplicated: Secondary | ICD-10-CM | POA: Diagnosis not present

## 2022-07-28 HISTORY — PX: GANGLION CYST EXCISION: SHX1691

## 2022-07-28 HISTORY — PX: CARPAL TUNNEL RELEASE: SHX101

## 2022-07-28 LAB — POCT PREGNANCY, URINE: Preg Test, Ur: NEGATIVE

## 2022-07-28 SURGERY — EXCISION, GANGLION CYST, WRIST
Anesthesia: General | Site: Wrist | Laterality: Right

## 2022-07-28 MED ORDER — CEFAZOLIN SODIUM-DEXTROSE 2-4 GM/100ML-% IV SOLN
2.0000 g | INTRAVENOUS | Status: AC
  Administered 2022-07-28: 2 g via INTRAVENOUS

## 2022-07-28 MED ORDER — MORPHINE SULFATE (PF) 2 MG/ML IV SOLN: 0.5000 mg | INTRAVENOUS | Status: DC | PRN

## 2022-07-28 MED ORDER — MIDAZOLAM HCL 2 MG/2ML IJ SOLN
INTRAMUSCULAR | Status: DC | PRN
  Administered 2022-07-28: 2 mg via INTRAVENOUS

## 2022-07-28 MED ORDER — HYDROCODONE-ACETAMINOPHEN 5-325 MG PO TABS: 1.0000 | ORAL_TABLET | Freq: Four times a day (QID) | ORAL | 0 refills | Status: DC | PRN

## 2022-07-28 MED ORDER — ONDANSETRON HCL 4 MG/2ML IJ SOLN: 4.0000 mg | Freq: Four times a day (QID) | INTRAMUSCULAR | Status: DC | PRN

## 2022-07-28 MED ORDER — HYDROCODONE-ACETAMINOPHEN 5-325 MG PO TABS: 1.0000 | ORAL_TABLET | ORAL | Status: DC | PRN

## 2022-07-28 MED ORDER — PROPOFOL 500 MG/50ML IV EMUL
INTRAVENOUS | Status: DC | PRN
  Administered 2022-07-28: 50 mg via INTRAVENOUS
  Administered 2022-07-28: 160 ug/kg/min via INTRAVENOUS

## 2022-07-28 MED ORDER — FENTANYL CITRATE (PF) 100 MCG/2ML IJ SOLN
INTRAMUSCULAR | Status: DC | PRN
  Administered 2022-07-28: 100 ug via INTRAVENOUS

## 2022-07-28 MED ORDER — BUPIVACAINE HCL 0.5 % IJ SOLN
INTRAMUSCULAR | Status: DC | PRN
  Administered 2022-07-28: 20 mL

## 2022-07-28 MED ORDER — ACETAMINOPHEN 325 MG PO TABS: 325.0000 mg | ORAL_TABLET | Freq: Four times a day (QID) | ORAL | Status: DC | PRN

## 2022-07-28 MED ORDER — 0.9 % SODIUM CHLORIDE (POUR BTL) OPTIME
TOPICAL | Status: DC | PRN
  Administered 2022-07-28: 150 mL

## 2022-07-28 MED ORDER — LACTATED RINGERS IV SOLN: INTRAVENOUS | Status: DC

## 2022-07-28 MED ORDER — HYDROCODONE-ACETAMINOPHEN 7.5-325 MG PO TABS: 1.0000 | ORAL_TABLET | ORAL | Status: DC | PRN

## 2022-07-28 MED ORDER — ONDANSETRON HCL 4 MG PO TABS: 4.0000 mg | ORAL_TABLET | Freq: Four times a day (QID) | ORAL | Status: DC | PRN

## 2022-07-28 MED ORDER — SODIUM CHLORIDE 0.9 % IV SOLN: INTRAVENOUS | Status: DC

## 2022-07-28 SURGICAL SUPPLY — 23 items
APL PRP STRL LF DISP 70% ISPRP (MISCELLANEOUS) ×1
BNDG CMPR STD VLCR NS LF 5.8X3 (GAUZE/BANDAGES/DRESSINGS) ×1
BNDG CMPR STD VLCR NS LF 5.8X4 (GAUZE/BANDAGES/DRESSINGS) ×1
BNDG ELASTIC 3X5.8 VLCR NS LF (GAUZE/BANDAGES/DRESSINGS) ×1 IMPLANT
BNDG ELASTIC 4X5.8 VLCR NS LF (GAUZE/BANDAGES/DRESSINGS) ×1 IMPLANT
CHLORAPREP W/TINT 26 (MISCELLANEOUS) ×1 IMPLANT
COVER LIGHT HANDLE UNIVERSAL (MISCELLANEOUS) ×2 IMPLANT
GAUZE SPONGE 4X4 12PLY STRL (GAUZE/BANDAGES/DRESSINGS) ×1 IMPLANT
GAUZE XEROFORM 1X8 LF (GAUZE/BANDAGES/DRESSINGS) ×1 IMPLANT
GLOVE SURG SYN 9.0 PF PI (GLOVE) ×1 IMPLANT
GOWN STRL REIN 2XL XLG LVL4 (GOWN DISPOSABLE) ×2 IMPLANT
GOWN STRL REUS W/ TWL LRG LVL3 (GOWN DISPOSABLE) ×1 IMPLANT
GOWN STRL REUS W/TWL LRG LVL3 (GOWN DISPOSABLE) ×1
KIT TURNOVER KIT A (KITS) ×1 IMPLANT
NS IRRIG 500ML POUR BTL (IV SOLUTION) ×1 IMPLANT
PACK EXTREMITY ARMC (MISCELLANEOUS) ×1 IMPLANT
PAD CAST 4YDX4 CTTN HI CHSV (CAST SUPPLIES) ×1 IMPLANT
PADDING CAST BLEND 3X4 STRL (MISCELLANEOUS) ×1 IMPLANT
PADDING CAST COTTON 4X4 STRL (CAST SUPPLIES) ×1
SPLINT CAST 1 STEP 3X12 (MISCELLANEOUS) ×1 IMPLANT
SUT ETHILON 4-0 (SUTURE) ×1
SUT ETHILON 4-0 FS2 18XMFL BLK (SUTURE) ×1
SUTURE ETHLN 4-0 FS2 18XMF BLK (SUTURE) ×1 IMPLANT

## 2022-07-28 NOTE — Op Note (Signed)
07/28/2022  12:39 PM  PATIENT:  Annamary Carolin  37 y.o. 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  POST-OPERATIVE DIAGNOSIS:  Ganglion of right wrist Carpal tunnel syndrome of right wrist  PROCEDURE:  Procedure(s): Right wrist volar ganglion cyst excision (Right) Right carpal tunnel release (Right)  SURGEON: Leitha Schuller, MD  ASSISTANTS: None  ANESTHESIA:   local and MAC  EBL:  Total I/O In: 400 [I.V.:400] Out: -   BLOOD ADMINISTERED:none  DRAINS: none   LOCAL MEDICATIONS USED:  MARCAINE     SPECIMEN:  No Specimen  DISPOSITION OF SPECIMEN:  N/A  COUNTS:  YES  TOURNIQUET:   Total Tourniquet Time Documented: Upper Arm (Right) - 23054 minutes Total: Upper Arm (Right) - 23054 minutes   IMPLANTS:none  DICTATION: .Dragon Dictation patient was brought to the operating room and after adequate sedation was given appropriate patient identification and timeout procedure was completed and 10 cc of half percent Marcaine without epinephrine was infiltrated the area of the planned incisions over the both the radial volar wrist in the area of the ganglion as well as the carpal tunnel.  The arm was then prepped and draped in the usual sterile fashion with a tourniquet applied the upper arm.  Tourniquet was raised and the carpal tunnel was addressed first with approximately 2 cm incision in line with the ring metacarpal skin subcutaneous tissue divided transcarpal ligament identified and a small incision made a vascular hemostat was placed deep to this protect the underlying structures and release was carried out distally and then proximally at the wrist flexion crease there appeared to be visible deformity to the nerve after release there is good vascular blush and no deformity the wound was irrigated and then closed with simple erupted 4-0 nylon skin sutures.  A curvilinear incision was made over the distal volar  wrist just proximal to the wrist flexion crease in the area of a palpable cyst subcutaneous tissue spread the radial artery and associated veins identified and protected going deep to them there was ganglion cyst which is visible it arose from the radiocarpal joint it was opened and debrided and then cautery as well as as a Therapist, nutritional to used to scrape the capsule to try to prevent recurrence and get adequate scar tissue for healing.  After when this was complete the wound was irrigated and the ankle tourniquet let down.  There was no significant bleeding and the wound was closed with simple interrupted 4-0 nylon followed by Xeroform 4 x 4 web roll volar splint and Ace wrap  PLAN OF CARE: Discharge to home after PACU  PATIENT DISPOSITION:  PACU - hemodynamically stable.

## 2022-07-28 NOTE — Discharge Instructions (Signed)
Keep arm elevated Work on finger range of motion Keep dressing clean and dry until you return visit Pain medicine as directed Call office if you are having problems [phone]

## 2022-07-28 NOTE — H&P (Signed)
  No chief complaint on file.   History of the Present Illness: Sarah Rivas is a 37 y.o. female here today.  The patient is a 37 year old female who is here to discuss possible ganglion cyst excision of the right hand. She has carpal tunnel symptoms with this and has a ganglion cyst within the carpal tunnel.  The patient reports experiencing significant numbness and tingling sensations in her fingers. She has no history of adverse reactions to general anesthesia and has no history of thromboembolic events.  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  The patient lives in [place].  I have reviewed past medical, surgical, social and family history, and allergies as documented in the EMR.  Past Medical History: No past medical history on file.  Past Surgical History: Past Surgical History: Procedure Laterality Date TUBAL LIGATION  Past Family History: Family History Problem Relation Age of Onset No Known Problems Mother No Known Problems Father  Medications: No current outpatient medications on file.  No current facility-administered medications for this visit.  Allergies: Allergies Allergen Reactions Codeine Other (See Comments) REACTION: hyper  hyper Sertraline Hcl Other (See Comments) REACTION: felt in "[place]"   Body mass index is 22.32 kg/m.  Review of Systems: A comprehensive 14 point ROS was performed, reviewed, and the pertinent orthopaedic findings are documented in the HPI.  Vitals: 07/02/22 1514 BP: 108/64   General Physical Examination:  General/Constitutional: No apparent distress: well-nourished and well developed. Eyes: Pupils equal, round with synchronous movement. Lungs: Clear to auscultation HEENT: Normal. Teeth normal, nothing loose or removable. Vascular: No edema, swelling or tenderness, except as noted in detailed exam. Cardiac: Heart rate and rhythm is  regular. Integumentary: No impressive skin lesions present, except as noted in detailed exam. Neuro/Psych: Normal mood and affect, oriented to person, place, and time. Musculoskeletal: Ganglion cyst within the carpal tunnel with finger numbness on the right. Good right hand strength.  Radiographs:  Ganglion cyst within the carpal tunnel.  Assessment: ICD-10-CM 1. Ganglion of right wrist M67.431 2. 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  The patient and I reviewed her imaging findings. A comprehensive discussion regarding cyst excision and carpal tunnel release was conducted with the patient, which is essentially 2 separate surgeries. She was advised her nerve may be numb upon awakening from surgery due to numbing medication. She will wear a fiberglass splint and work on range of motion of the fingers following surgery.  She will be scheduled for surgery in the near future.  Surgical Risks:  The nature of the condition and the proposed procedure has been reviewed in detail with the patient. Surgical versus non-surgical options and prognosis for recovery have been reviewed and the inherent risks and benefits of each have been discussed including the risks of infection, bleeding, injury to nerves/blood vessels/tendons, incomplete relief of symptoms, persisting pain and/or stiffness, loss of function, complex regional pain syndrome, failure of the procedure, as appropriate.  Teeth: Nothing loose or removable.  Document Attestation: I, Dawn Royse, have reviewed and updated documentation for [facility], MD, utilizing Nuance DAX.   Electronically signed by Marlena Clipper, MD at 07/03/2022 8:10 AM EDT   Reviewed  H+P. No changes noted.

## 2022-07-28 NOTE — Transfer of Care (Signed)
Immediate Anesthesia Transfer of Care Note  Patient: Sarah Rivas  Procedure(s) Performed: Right wrist volar ganglion cyst excision (Right: Wrist) Right carpal tunnel release (Right: Wrist)  Patient Location: PACU  Anesthesia Type: General  Level of Consciousness: awake, alert  and patient cooperative  Airway and Oxygen Therapy: Patient Spontanous Breathing and Patient connected to supplemental oxygen  Post-op Assessment: Post-op Vital signs reviewed, Patient's Cardiovascular Status Stable, Respiratory Function Stable, Patent Airway and No signs of Nausea or vomiting  Post-op Vital Signs: Reviewed and stable  Complications: No notable events documented.

## 2022-07-28 NOTE — Anesthesia Postprocedure Evaluation (Signed)
Anesthesia Post Note  Patient: MAKAYELA SECREST  Procedure(s) Performed: Right wrist volar ganglion cyst excision (Right: Wrist) Right carpal tunnel release (Right: Wrist)  Patient location during evaluation: PACU Anesthesia Type: General Level of consciousness: awake and alert, oriented and patient cooperative Pain management: pain level controlled Vital Signs Assessment: post-procedure vital signs reviewed and stable Respiratory status: spontaneous breathing, nonlabored ventilation and respiratory function stable Cardiovascular status: blood pressure returned to baseline and stable Postop Assessment: adequate PO intake Anesthetic complications: no   No notable events documented.   Last Vitals:  Vitals:   07/28/22 1300 07/28/22 1314  BP: 103/61 117/76  Pulse: (!) 52 (!) 58  Resp: 11 12  Temp:  36.4 C  SpO2: 100% 98%    Last Pain:  Vitals:   07/28/22 1314  TempSrc:   PainSc: 0-No pain                 Reed Breech

## 2022-07-29 ENCOUNTER — Encounter: Payer: Self-pay | Admitting: Orthopedic Surgery

## 2022-07-31 ENCOUNTER — Other Ambulatory Visit: Payer: Self-pay

## 2022-07-31 ENCOUNTER — Emergency Department
Admission: EM | Admit: 2022-07-31 | Discharge: 2022-07-31 | Disposition: A | Discharge status: Home / Self Care | Payer: Medicaid Other | Attending: Student in an Organized Health Care Education/Training Program | Admitting: Student in an Organized Health Care Education/Training Program

## 2022-07-31 DIAGNOSIS — T7840XA Allergy, unspecified, initial encounter: Secondary | ICD-10-CM | POA: Diagnosis not present

## 2022-07-31 DIAGNOSIS — R220 Localized swelling, mass and lump, head: Secondary | ICD-10-CM | POA: Diagnosis present

## 2022-07-31 MED ORDER — TRAMADOL HCL 50 MG PO TABS: 50.0000 mg | ORAL_TABLET | Freq: Four times a day (QID) | ORAL | 0 refills | Status: DC | PRN

## 2022-07-31 MED ORDER — EPINEPHRINE 0.3 MG/0.3ML IJ SOAJ: 0.3000 mg | INTRAMUSCULAR | 1 refills | Status: DC | PRN

## 2022-07-31 MED ORDER — TRAMADOL HCL 50 MG PO TABS
50.0000 mg | ORAL_TABLET | Freq: Once | ORAL | Status: AC
  Administered 2022-07-31: 50 mg via ORAL
  Filled 2022-07-31: qty 1

## 2022-07-31 NOTE — ED Triage Notes (Signed)
Pt to ED via [facility] for c/o facial swelling, hives on right arm, n/v. Pt has codeine allergy, but has not taken any. Hx of carpal tunnel surgery on  Tuesday, took hydrocodone yesterday.  EMS gave 0.3 epi, 50 mg benadryl, 125 solumedrol, 4 mg zofran, 250 mL NS  BP 112/64 HR 60 O2 97% RA

## 2022-07-31 NOTE — ED Provider Notes (Signed)
[facility] Provider Note    Event Date/Time   First MD Initiated Contact with Patient 07/31/22 [phone]     (approximate)   History   Allergic Reaction   HPI  Sarah Rivas is a 37 y.o. female who presents to the ER for evaluation of hives facial swelling nausea shortness of breath that started this morning.  Thinks that she had an allergic reaction to codeine for which she was prescribed pain medication after carpal tunnel surgery.  Patient was rushed to local fire department where she was given epi.  EMS brought patient and after giving Solu-Medrol Benadryl fluids.  She feels improved.     Physical Exam   Triage Vital Signs: ED Triage Vitals  Encounter Vitals Group     BP 07/31/22 0843 (!) 108/55     Systolic BP Percentile --      Diastolic BP Percentile --      Pulse Rate 07/31/22 0843 (!) 51     Resp 07/31/22 0843 16     Temp 07/31/22 0843 97.8 F (36.6 C)     Temp Source 07/31/22 0843 Oral     SpO2 07/31/22 0843 100 %     Weight 07/31/22 0845 120 lb (54.4 kg)     Height 07/31/22 0845 5\' 3"  (1.6 m)     Head Circumference --      Peak Flow --      Pain Score 07/31/22 0844 8     Pain Loc --      Pain Education --      Exclude from Growth Chart --     Most recent vital signs: Vitals:   07/31/22 0843 07/31/22 1100  BP: (!) 108/55   Pulse: (!) 51 65  Resp: 16 19  Temp: 97.8 F (36.6 C)   SpO2: 100% 98%     Constitutional: Alert  Eyes: Conjunctivae are normal.  Head: Atraumatic. Nose: No congestion/rhinnorhea. Mouth/Throat: Mucous membranes are moist.   Neck: Painless ROM.  Cardiovascular:   Good peripheral circulation. Respiratory: Normal respiratory effort.  No retractions.  Gastrointestinal: Soft and nontender.  Musculoskeletal:  no deformity Neurologic:  MAE spontaneously. No gross focal neurologic deficits are appreciated.  Skin:  Skin is warm, dry and intact. No rash noted. Psychiatric: Mood and affect are normal. Speech  and behavior are normal.    ED Results / Procedures / Treatments   Labs (all labs ordered are listed, but only abnormal results are displayed) Labs Reviewed - No data to display   EKG     RADIOLOGY    PROCEDURES:  Critical Care performed: No  Procedures   MEDICATIONS ORDERED IN ED: Medications  traMADol (ULTRAM) tablet 50 mg (50 mg Oral Given 07/31/22 0929)     IMPRESSION / MDM / ASSESSMENT AND PLAN / ED COURSE  I reviewed the triage vital signs and the nursing notes.                              Differential diagnosis includes, but is not limited to, anaphylaxis, anaphylactoid, urticaria, drug reaction  Patient presenting to the ER for evaluation of symptoms as described above.  Based on symptoms, risk factors and considered above differential, this presenting complaint could reflect a potentially life-threatening illness therefore the patient will be placed on continuous pulse oximetry and telemetry for monitoring.  She appears to be properly treated after EpiPen will continue to observe.  Do not  feel that diagnostic testing clinically indicated.  Clinical Course as of 07/31/22 1143  Fri Jul 31, 2022  1142 Patient reassessed with improvement in symptoms.  Does appear stable and appropriate for outpatient follow-up. [PR]    Clinical Course User Index [PR] Willy Eddy, MD     FINAL CLINICAL IMPRESSION(S) / ED DIAGNOSES   Final diagnoses:  Allergic reaction, initial encounter     Rx / DC Orders   ED Discharge Orders          Ordered    EPINEPHrine 0.3 mg/0.3 mL IJ SOAJ injection  As needed        07/31/22 1143    traMADol (ULTRAM) 50 MG tablet  Every 6 hours PRN        07/31/22 1143             Note:  This document was prepared using Dragon voice recognition software and may include unintentional dictation errors.    Willy Eddy, MD 07/31/22 [phone]

## 2022-08-25 ENCOUNTER — Encounter: Payer: Medicaid Other | Admitting: Certified Nurse Midwife

## 2022-10-01 ENCOUNTER — Encounter: Payer: Medicaid Other | Admitting: Certified Nurse Midwife

## 2022-10-13 ENCOUNTER — Encounter: Payer: Self-pay | Admitting: Certified Nurse Midwife

## 2022-10-13 ENCOUNTER — Ambulatory Visit (INDEPENDENT_AMBULATORY_CARE_PROVIDER_SITE_OTHER): Payer: Medicaid Other | Admitting: Certified Nurse Midwife

## 2022-10-13 ENCOUNTER — Other Ambulatory Visit (HOSPITAL_COMMUNITY)
Admission: RE | Admit: 2022-10-13 | Discharge: 2022-10-13 | Disposition: A | Discharge status: Home / Self Care | Payer: Medicaid Other | Source: Ambulatory Visit | Attending: Certified Nurse Midwife | Admitting: Certified Nurse Midwife

## 2022-10-13 VITALS — BP 107/74 | HR 85 | Wt 121.0 lb

## 2022-10-13 DIAGNOSIS — N921 Excessive and frequent menstruation with irregular cycle: Secondary | ICD-10-CM

## 2022-10-13 DIAGNOSIS — Z124 Encounter for screening for malignant neoplasm of cervix: Secondary | ICD-10-CM

## 2022-10-13 DIAGNOSIS — Z7689 Persons encountering health services in other specified circumstances: Secondary | ICD-10-CM

## 2022-10-13 MED ORDER — IBUPROFEN 800 MG PO TABS: 800.0000 mg | ORAL_TABLET | Freq: Three times a day (TID) | ORAL | 1 refills | Status: DC | PRN

## 2022-10-13 NOTE — Progress Notes (Signed)
Practice, [facility]   Chief Complaint  Patient presents with   Menstrual Problem    HPI:      Sarah Rivas is a 37 y.o. [id number] whose LMP was Patient's last menstrual period was 10/05/2022., presents today for heavy & painful periods. Sarah Rivas reports pain with cycles often causes nausea & vomiting. Pain & bleeding have increased in the last 3 years since having her last child & having BTL. She reports a history of endometriosis, migraine without aura, syncope without explanation  She has taken birth control in the past with bad side effects and would like to avoid. She declines Depo due to family members with complications on Depo as well as fear of needles and prefers to avoid IUD or Nexplanon.     Patient Active Problem List   Diagnosis Date Noted   S/P tubal ligation 02/08/2019   Postpartum care following vaginal delivery 01/18/2019   Encounter for tubal ligation 01/18/2019   Normal labor and delivery 01/17/2019   Supervision of other normal pregnancy, antepartum 06/10/2018   Acute upper respiratory infection 04/20/2008   FIBROCYSTIC BREAST DISEASE 11/28/2007   ABDOMINAL PAIN, RIGHT LOWER QUADRANT 06/15/2007   Hearing loss 05/06/2007   CHEST PAIN 05/06/2007   Migraine without aura 09/01/2006   DISORDER, MENSTRUAL NOS 09/01/2006   Anxiety state 04/19/2006   ANXIETY, SITUATIONAL 04/19/2006   DEPRESSION 04/19/2006   SYNCOPE 04/19/2006    Past Surgical History:  Procedure Laterality Date   CARPAL TUNNEL RELEASE Right 07/28/2022   Procedure: Right carpal tunnel release;  Surgeon: Kennedy Bucker, MD;  Location: [facility];  Service: Orthopedics;  Laterality: Right;   DILATION AND CURETTAGE OF UTERUS     GANGLION CYST EXCISION Right 07/28/2022   Procedure: Right wrist volar ganglion cyst excision;  Surgeon: Kennedy Bucker, MD;  Location: [facility];  Service: Orthopedics;  Laterality: Right;   TONSILLECTOMY     TUBAL LIGATION Bilateral  01/18/2019   Procedure: POST PARTUM TUBAL LIGATION, BILATERIAL;  Surgeon: Natale Milch, MD;  Location: [facility] ORS;  Service: Gynecology;  Laterality: Bilateral;    Family History  Problem Relation Age of Onset   Prostate cancer Maternal Grandfather 36   Lung cancer Maternal Grandfather 18   Anesthesia problems Neg Hx     Social History   Socioeconomic History   Marital status: Married    Spouse name: Not on file   Number of children: Not on file   Years of education: Not on file   Highest education level: Not on file  Occupational History   Not on file  Tobacco Use   Smoking status: Every Day    Current packs/day: 0.50    Average packs/day: 0.5 packs/day for 19.8 years (9.9 ttl pk-yrs)    Types: Cigarettes    Start date: 2005   Smokeless tobacco: Never  Vaping Use   Vaping status: Never Used  Substance and Sexual Activity   Alcohol use: No   Drug use: No   Sexual activity: Not Currently    Birth control/protection: Surgical    Comment: BTL  Other Topics Concern   Not on file  Social History Narrative   Not on file   Social Determinants of Health   Financial Resource Strain: Not on file  Food Insecurity: Not on file  Transportation Needs: Not on file  Physical Activity: Not on file  Stress: Not on file  Social Connections: Not on file  Intimate Partner Violence: Not on file  Outpatient Medications Prior to Visit  Medication Sig Dispense Refill   EPINEPHrine 0.3 mg/0.3 mL IJ SOAJ injection Inject 0.3 mg into the muscle as needed for anaphylaxis. 1 each 1   vitamin D3 (CHOLECALCIFEROL) 25 MCG tablet Take 1,000 Units by mouth daily.     traMADol (ULTRAM) 50 MG tablet Take 1 tablet (50 mg total) by mouth every 6 (six) hours as needed. (Patient not taking: Reported on 10/13/2022) 10 tablet 0   No facility-administered medications prior to visit.      ROS:  Review of Systems  Constitutional:  Positive for fatigue.       With cycles  Respiratory:  Negative.    Cardiovascular: Negative.   Genitourinary:  Positive for menstrual problem.     OBJECTIVE:   Vitals:  BP 107/74   Pulse 85   Wt 121 lb (54.9 kg)   LMP 10/05/2022   Breastfeeding No   BMI 21.43 kg/m   Physical Exam Vitals reviewed.  Constitutional:      Appearance: Normal appearance.  Cardiovascular:     Rate and Rhythm: Normal rate.  Pulmonary:     Effort: Pulmonary effort is normal.  Genitourinary:    General: Normal vulva.     Tanner stage (genital): 5.     Vagina: Normal.     Cervix: Normal.     Uterus: Normal.      Adnexa:        Right: No mass or tenderness.         Left: No mass or tenderness.       Comments: Uterus smooth, mobile, nontender. Neurological:     Mental Status: She is alert.     Results: No results found for this or any previous visit (from the past 24 hour(s)).   Assessment/Plan: Encounter to establish care - Plan: CBC, TSH, Cytology - PAP  Menorrhagia with irregular cycle - Plan: CBC, TSH, Cytology - PAP  Cervical cancer screening - Plan: CBC, TSH, Cytology - PAP  Options for management of menorrhagia & dysmenorrhea reviewed. Discussed use of hormonal contraception-options including COC, progestin only pills, Depo, Mirena, Nexplanon reviewed. As Takila is a smoker and reports recent elevated blood pressure we discussed avoiding estrogen containing contraceptives. She prefers to start ibuprofen 1-2d prior to start of cycle and continue taking for first 2-3d of cycle to see if she can achieve pain management as well as decrease in bleeding. Information regarding vitex/chasteberry provided via mychart, consider starting daily supplement of 200mg  twice daily for several months to help regulate cycle. She will contact this Clinical research associate via Mychart if she desires to trial progestin only pills if she does not have desired success with ibuprofen alone. Discussed that ablation may be an option after attempts at less interventive means however  the lifespan of ablation is usually ~5y and has less success for younger patients who are farther from menopause.   Pap collected.  Meds ordered this encounter  Medications   ibuprofen (ADVIL) 800 MG tablet    Sig: Take 1 tablet (800 mg total) by mouth every 8 (eight) hours as needed for moderate pain. Begin taking day before menstrual cycle starts then continue for first 2 days of cycle.    Dispense:  60 tablet    Refill:  1    Order Specific Question:   Supervising Provider    Answer:   Waymon Budge     Dominica Severin, CNM 10/13/2022 2:34 PM

## 2022-10-14 ENCOUNTER — Other Ambulatory Visit: Payer: Self-pay | Admitting: Certified Nurse Midwife

## 2022-10-14 DIAGNOSIS — R7989 Other specified abnormal findings of blood chemistry: Secondary | ICD-10-CM

## 2022-10-14 LAB — CBC
Hematocrit: 46.3 % (ref 34.0–46.6)
Hemoglobin: 15.4 g/dL (ref 11.1–15.9)
MCH: 32.6 pg (ref 26.6–33.0)
MCHC: 33.3 g/dL (ref 31.5–35.7)
MCV: 98 fL — ABNORMAL HIGH (ref 79–97)
Platelets: 268 10*3/uL (ref 150–450)
RBC: 4.73 x10E6/uL (ref 3.77–5.28)
RDW: 11.8 % (ref 11.7–15.4)
WBC: 6.4 10*3/uL (ref 3.4–10.8)

## 2022-10-14 LAB — TSH: TSH: 6.38 u[IU]/mL — ABNORMAL HIGH (ref 0.450–4.500)

## 2022-10-16 LAB — CYTOLOGY - PAP
Comment: NEGATIVE
Diagnosis: NEGATIVE
High risk HPV: NEGATIVE

## 2022-10-22 ENCOUNTER — Other Ambulatory Visit: Payer: Medicaid Other

## 2022-10-22 DIAGNOSIS — R7989 Other specified abnormal findings of blood chemistry: Secondary | ICD-10-CM

## 2022-10-23 LAB — TSH+T4F+T3FREE+THYABS+TPO+VD25
Free T4: 0.94 ng/dL (ref 0.82–1.77)
T3, Free: 2.7 pg/mL (ref 2.0–4.4)
TSH: 7.62 u[IU]/mL — ABNORMAL HIGH (ref 0.450–4.500)
Thyroglobulin Antibody: 14 [IU]/mL — ABNORMAL HIGH (ref 0.0–0.9)
Thyroperoxidase Ab SerPl-aCnc: 290 [IU]/mL — ABNORMAL HIGH (ref 0–34)
Vit D, 25-Hydroxy: 23.8 ng/mL — ABNORMAL LOW (ref 30.0–100.0)

## 2022-10-25 ENCOUNTER — Other Ambulatory Visit: Payer: Self-pay | Admitting: Certified Nurse Midwife

## 2022-10-25 MED ORDER — LEVOTHYROXINE SODIUM 50 MCG PO TABS: 50.0000 ug | ORAL_TABLET | Freq: Every day | ORAL | 1 refills | Status: DC

## 2022-10-25 MED ORDER — VITAMIN D (ERGOCALCIFEROL) 1.25 MG (50000 UNIT) PO CAPS: 50000.0000 [IU] | ORAL_CAPSULE | ORAL | 0 refills | Status: AC

## 2022-11-27 ENCOUNTER — Encounter: Payer: Self-pay | Admitting: Certified Nurse Midwife

## 2022-11-27 ENCOUNTER — Ambulatory Visit: Payer: Medicaid Other | Admitting: Certified Nurse Midwife

## 2022-11-27 VITALS — BP 96/58 | HR 79 | Ht 63.0 in | Wt 117.0 lb

## 2022-11-27 DIAGNOSIS — E559 Vitamin D deficiency, unspecified: Secondary | ICD-10-CM | POA: Diagnosis not present

## 2022-11-27 DIAGNOSIS — N921 Excessive and frequent menstruation with irregular cycle: Secondary | ICD-10-CM | POA: Diagnosis not present

## 2022-11-27 DIAGNOSIS — E063 Autoimmune thyroiditis: Secondary | ICD-10-CM | POA: Diagnosis not present

## 2022-11-27 DIAGNOSIS — E039 Hypothyroidism, unspecified: Secondary | ICD-10-CM | POA: Insufficient documentation

## 2022-11-27 NOTE — Progress Notes (Signed)
Practice, [facility]   Chief Complaint  Patient presents with   Follow-up    Ibuprofen did help as discussed    HPI:      Sarah Rivas is a 37 y.o. [id number] whose LMP was Patient's last menstrual period was 11/26/2022 (exact date)., presents today for hypothyroidism & menorrhagia follow up. She has had two cycles since beginning ibuprofen prior to & continuing for first 2 days of cycle and has seen improvement in symptoms. Would like to continue this. Reports minimal improvement on other symptoms, fatigue, mood lability, endorses weight change (4 pounds less today than at last visit). Also discussed today that her 37yo is followed by neurology & rheumatology and she has concerns about whether her thyroid condition is something that could also be affecting him. Interested in referral to endocrinology for continued management of her hypothyroidism.  Has taken levothyroxine consistently on empty stomach. Completing course of vitamin D.    Patient Active Problem List   Diagnosis Date Noted   S/P tubal ligation 02/08/2019   Postpartum care following vaginal delivery 01/18/2019   Encounter for tubal ligation 01/18/2019   Normal labor and delivery 01/17/2019   Supervision of other normal pregnancy, antepartum 06/10/2018   Acute upper respiratory infection 04/20/2008   FIBROCYSTIC BREAST DISEASE 11/28/2007   ABDOMINAL PAIN, RIGHT LOWER QUADRANT 06/15/2007   Hearing loss 05/06/2007   CHEST PAIN 05/06/2007   Migraine without aura 09/01/2006   DISORDER, MENSTRUAL NOS 09/01/2006   Anxiety state 04/19/2006   ANXIETY, SITUATIONAL 04/19/2006   DEPRESSION 04/19/2006   SYNCOPE 04/19/2006    Past Surgical History:  Procedure Laterality Date   CARPAL TUNNEL RELEASE Right 07/28/2022   Procedure: Right carpal tunnel release;  Surgeon: Kennedy Bucker, MD;  Location: [facility];  Service: Orthopedics;  Laterality: Right;   DILATION AND CURETTAGE OF UTERUS     GANGLION  CYST EXCISION Right 07/28/2022   Procedure: Right wrist volar ganglion cyst excision;  Surgeon: Kennedy Bucker, MD;  Location: [facility];  Service: Orthopedics;  Laterality: Right;   TONSILLECTOMY     TUBAL LIGATION Bilateral 01/18/2019   Procedure: POST PARTUM TUBAL LIGATION, BILATERIAL;  Surgeon: Natale Milch, MD;  Location: [facility] OR[facility];  Service: Gynecology;  Laterality: Bilateral;    Family History  Problem Relation Age of Onset   Prostate cancer Maternal Grandfather 25   Lung cancer Maternal Grandfather 65   Anesthesia problems Neg Hx     Social History   Socioeconomic History   Marital status: Married    Spouse name: Not on file   Number of children: Not on file   Years of education: Not on file   Highest education level: Not on file  Occupational History   Not on file  Tobacco Use   Smoking status: Every Day    Current packs/day: 0.50    Average packs/day: 0.5 packs/day for 19.9 years (9.9 ttl pk-yrs)    Types: Cigarettes    Start date: 2005   Smokeless tobacco: Never  Vaping Use   Vaping status: Never Used  Substance and Sexual Activity   Alcohol use: No   Drug use: No   Sexual activity: Yes    Birth control/protection: Surgical    Comment: BTL  Other Topics Concern   Not on file  Social History Narrative   Not on file   Social Determinants of Health   Financial Resource Strain: Not on file  Food Insecurity: Not on file  Transportation  Needs: Not on file  Physical Activity: Not on file  Stress: Not on file  Social Connections: Not on file  Intimate Partner Violence: Not on file    Outpatient Medications Prior to Visit  Medication Sig Dispense Refill   EPINEPHrine 0.3 mg/0.3 mL IJ SOAJ injection Inject 0.3 mg into the muscle as needed for anaphylaxis. 1 each 1   ibuprofen (ADVIL) 800 MG tablet Take 1 tablet (800 mg total) by mouth every 8 (eight) hours as needed for moderate pain. Begin taking day before menstrual cycle starts then  continue for first 2 days of cycle. 60 tablet 1   levothyroxine (SYNTHROID) 50 MCG tablet Take 1 tablet (50 mcg total) by mouth daily before breakfast. 30 tablet 1   Vitamin D, Ergocalciferol, (DRISDOL) 1.25 MG (50000 UNIT) CAPS capsule Take 1 capsule (50,000 Units total) by mouth every 7 (seven) days. 6 capsule 0   vitamin D3 (CHOLECALCIFEROL) 25 MCG tablet Take 1,000 Units by mouth daily.     No facility-administered medications prior to visit.      ROS:  Review of Systems  Constitutional:  Positive for fatigue and unexpected weight change.  Respiratory: Negative.    Cardiovascular: Negative.   Gastrointestinal:  Positive for nausea.       If takes levothyroxine and immediately gets up has nausea, taking at 4am and laying back down has helped this.  Psychiatric/Behavioral:  Positive for dysphoric mood.      OBJECTIVE:   Vitals:  BP (!) 96/58   Pulse 79   Ht 5\' 3"  (1.6 m)   Wt 117 lb (53.1 kg)   LMP 11/26/2022 (Exact Date)   BMI 20.73 kg/m   Physical Exam Vitals reviewed.  Constitutional:      Appearance: Normal appearance.  Cardiovascular:     Rate and Rhythm: Normal rate.  Pulmonary:     Effort: Pulmonary effort is normal.  Neurological:     General: No focal deficit present.     Mental Status: She is alert and oriented to person, place, and time.  Psychiatric:        Mood and Affect: Mood normal.        Behavior: Behavior normal.     Results: No results found for this or any previous visit (from the past 24 hour(s)).   Assessment/Plan: Hashimoto thyroiditis - Plan: TSH+T4F+T3Free+ThyAbs+TPO+VD25, Ambulatory referral to Endocrinology  Vitamin D deficiency - Plan: TSH+T4F+T3Free+ThyAbs+TPO+VD25, Ambulatory referral to Endocrinology  Menorrhagia with irregular cycle  Thyroid panel as well as vitamin D drawn today. Anticipate needing increase in levothyroxine dose given minimal improvement in symptoms. Referral to endocrinology placed. If dose changed will  need labs rechecked one month after dose change. Continue ibuprofen.   No orders of the defined types were placed in this encounter.    Dominica Severin, CNM 11/27/2022 11:40 AM

## 2022-11-28 ENCOUNTER — Other Ambulatory Visit: Payer: Self-pay | Admitting: Certified Nurse Midwife

## 2022-11-28 LAB — TSH+T4F+T3FREE+THYABS+TPO+VD25
Free T4: 1.33 ng/dL (ref 0.82–1.77)
T3, Free: 2.5 pg/mL (ref 2.0–4.4)
TSH: 4.49 u[IU]/mL (ref 0.450–4.500)
Thyroglobulin Antibody: 9.2 [IU]/mL — ABNORMAL HIGH (ref 0.0–0.9)
Thyroperoxidase Ab SerPl-aCnc: 293 [IU]/mL — ABNORMAL HIGH (ref 0–34)
Vit D, 25-Hydroxy: 33.5 ng/mL (ref 30.0–100.0)

## 2022-11-28 MED ORDER — LEVOTHYROXINE SODIUM 75 MCG PO TABS: 75.0000 ug | ORAL_TABLET | Freq: Every day | ORAL | 1 refills | Status: DC

## 2022-12-16 ENCOUNTER — Telehealth: Payer: Self-pay | Admitting: Certified Nurse Midwife

## 2022-12-16 DIAGNOSIS — E063 Autoimmune thyroiditis: Secondary | ICD-10-CM

## 2022-12-16 NOTE — Telephone Encounter (Signed)
This patient called this AM to scheduled an lab only appointment to follow up on her medication she is taking. She is scheduled for 12/31. There is no lab order. Please advise?

## 2023-01-05 ENCOUNTER — Other Ambulatory Visit: Payer: Medicaid Other

## 2023-02-17 ENCOUNTER — Emergency Department (HOSPITAL_COMMUNITY)
Admission: EM | Admit: 2023-02-17 | Discharge: 2023-02-17 | Disposition: A | Discharge status: Home / Self Care | Payer: Medicaid Other | Attending: Emergency Medicine | Admitting: Emergency Medicine

## 2023-02-17 ENCOUNTER — Other Ambulatory Visit: Payer: Self-pay

## 2023-02-17 ENCOUNTER — Emergency Department (HOSPITAL_BASED_OUTPATIENT_CLINIC_OR_DEPARTMENT_OTHER): Payer: Medicaid Other

## 2023-02-17 DIAGNOSIS — R7989 Other specified abnormal findings of blood chemistry: Secondary | ICD-10-CM

## 2023-02-17 DIAGNOSIS — R55 Syncope and collapse: Secondary | ICD-10-CM

## 2023-02-17 DIAGNOSIS — R946 Abnormal results of thyroid function studies: Secondary | ICD-10-CM | POA: Diagnosis not present

## 2023-02-17 DIAGNOSIS — E039 Hypothyroidism, unspecified: Secondary | ICD-10-CM | POA: Insufficient documentation

## 2023-02-17 LAB — COMPREHENSIVE METABOLIC PANEL
ALT: 22 U/L (ref 0–44)
AST: 27 U/L (ref 15–41)
Albumin: 4 g/dL (ref 3.5–5.0)
Alkaline Phosphatase: 55 U/L (ref 38–126)
Anion gap: 8 (ref 5–15)
BUN: 8 mg/dL (ref 6–20)
CO2: 23 mmol/L (ref 22–32)
Calcium: 9.1 mg/dL (ref 8.9–10.3)
Chloride: 105 mmol/L (ref 98–111)
Creatinine, Ser: 0.96 mg/dL (ref 0.44–1.00)
GFR, Estimated: >60 mL/min (ref >60)
Glucose, Bld: 106 mg/dL — ABNORMAL HIGH (ref 70–99)
Potassium: 4.2 mmol/L (ref 3.5–5.1)
Sodium: 136 mmol/L (ref 135–145)
Total Bilirubin: 1.1 mg/dL (ref 0.0–1.2)
Total Protein: 6.6 g/dL (ref 6.5–8.1)

## 2023-02-17 LAB — CBC WITH DIFFERENTIAL/PLATELET
Abs Immature Granulocytes: 0.02 10*3/uL (ref 0.00–0.07)
Basophils Absolute: 0.1 10*3/uL (ref 0.0–0.1)
Basophils Relative: 1 %
Eosinophils Absolute: 0.1 10*3/uL (ref 0.0–0.5)
Eosinophils Relative: 1 %
HCT: 43.7 % (ref 36.0–46.0)
Hemoglobin: 15.2 g/dL — ABNORMAL HIGH (ref 12.0–15.0)
Immature Granulocytes: 0 %
Lymphocytes Relative: 20 %
Lymphs Abs: 1.3 10*3/uL (ref 0.7–4.0)
MCH: 32.1 pg (ref 26.0–34.0)
MCHC: 34.8 g/dL (ref 30.0–36.0)
MCV: 92.4 fL (ref 80.0–100.0)
Monocytes Absolute: 0.3 10*3/uL (ref 0.1–1.0)
Monocytes Relative: 5 %
Neutro Abs: 4.9 10*3/uL (ref 1.7–7.7)
Neutrophils Relative %: 73 %
Platelets: 257 10*3/uL (ref 150–400)
RBC: 4.73 MIL/uL (ref 3.87–5.11)
RDW: 12.1 % (ref 11.5–15.5)
WBC: 6.7 10*3/uL (ref 4.0–10.5)
nRBC: 0 % (ref 0.0–0.2)

## 2023-02-17 LAB — ECHOCARDIOGRAM COMPLETE
Area-P 1/2: 3.99 cm2
Height: 63 in
S' Lateral: 2.6 cm
Weight: 2000 [oz_av]

## 2023-02-17 LAB — TSH: TSH: 10.899 u[IU]/mL — ABNORMAL HIGH (ref 0.350–4.500)

## 2023-02-17 LAB — MAGNESIUM: Magnesium: 1.8 mg/dL (ref 1.7–2.4)

## 2023-02-17 LAB — T4, FREE: Free T4: 0.92 ng/dL (ref 0.61–1.12)

## 2023-02-17 MED ORDER — SODIUM CHLORIDE 0.9 % IV BOLUS
1000.0000 mL | Freq: Once | INTRAVENOUS | Status: AC
  Administered 2023-02-17: 1000 mL via INTRAVENOUS

## 2023-02-17 NOTE — ED Triage Notes (Signed)
Patient presents via EMS from home for syncopal episode. Per EMS, she was found laying on the floor with tremors. Patient dizzy when sitting up. Per EMS, patient has chronically low BP, history of POTS, and Hashimotos. Patient was supposed to have a cardiology appointment this morning. Patient states that she has had four syncopal episodes within the last three days and that before she passed out today her watch alerted her that her HR was 200. Patient's syncopal episode was witnessed by the patient's mother-in-law who stated that patient did not hit her head. Pt received 4mg  IV zofran en route from EMS.

## 2023-02-17 NOTE — ED Provider Notes (Signed)
  Physical Exam  BP 106/80   Pulse 67   Temp 98.3 F (36.8 C) (Oral)   Resp 13   Ht 5\' 3"  (1.6 m)   Wt 56.7 kg   LMP 02/10/2023   SpO2 98%   BMI 22.14 kg/m   Physical Exam  Procedures  Procedures  ED Course / MDM   Clinical Course as of 02/17/23 1740  Wed Feb 17, 2023  1537 Assumed care from Dr. Jeraldine Loots.  38 year old female with a history of POTS and Hashimoto's thyroiditis who passed out before going to see cards so was brought here. Awaiting cardiology recommendations.  [RP]  1630 Cardiology is recommending an echo stat.  They will review and get back to [place] on disposition. [RP]  1740 Echo performed.  No abnormalities.  Cardiology recommends outpatient follow-up with [facility] clinic.  Patient also informed of her elevated TSH which she will need to follow-up with her primary doctor about.  Also instructed her to inquire about a Holter monitor.  Return precautions discussed prior to discharge. [RP]    Clinical Course User Index [RP] Rondel Baton, MD   Medical Decision Making Amount and/or Complexity of Data Reviewed Labs: ordered.      Rondel Baton, MD 02/17/23 [id number]

## 2023-02-17 NOTE — Progress Notes (Deleted)
Error

## 2023-02-17 NOTE — Consult Note (Addendum)
Cardiology Consultation   Patient ID: Bloomville COHICK MRN: [id number]; DOB: September 29, 1985  Admit date: 02/17/2023 Date of Consult: 02/17/2023  PCP:  [facility]   [facility] Providers Cardiologist:  None Was going to see Gavin Potters  Patient Profile:   Sarah Rivas is a 38 y.o. female with a hx of Hashimoto's thyroiditis who is being seen 02/17/2023 for the evaluation of syncope at the request of ED.  History of Present Illness:   Sarah Rivas reports no prior family history of any significant cardiac disease nor does she have any herself.  Smokes half a pack per day.  No drugs or alcohol. About to be   Currently patient is being evaluated for recurrent episodes of syncope. She reports frequent episodes 4-5 in the last 2 to 3 days.  Although she has no formal diagnosis of POTS she reports having recurrent episodes of abnormal racing heart rates in episodes of syncope since the age of at least 54.  For the past 2 years these episodes progressively have gotten worse and then the past few days symptoms have gotten significantly worse with more frequent occurrences of syncope.  She was on her way to a cardiology appointment today but called EMS because she reportedly passed out.  One of the episodes was witnessed.  She reports during these episodes she starts developing tremors, feels weak in her knees and starts having muscle weakness and loses consciousness.  Denies possible dehydration or skipping meals.  She purchased a watch that tracks her heart rates that show a minimum heart rate around 65 and a max heart rate of 112.  However she reports episodes of tachycardia on a pulse oximeter because her husband is a IT sales professional with heart rates in the 200s.  Heart rates have been in the 70s while here.  I asked patient to stand up and heart rates went up to the 90s but responded appropriately. No orthostatics documented.   Otherwise reports no other cardiac symptoms  such as chest pain, peripheral edema, orthopnea, shortness of breath.  Blood pressure stable 104/73.  TSH 10.899.  T4 1.19.  Cortisol 21.2.  No electrolyte abnormalities.  Vitamin D 33.5.  Hemoglobin 15.2.  Past Medical History:  Diagnosis Date   Anxiety    Depression     Past Surgical History:  Procedure Laterality Date   CARPAL TUNNEL RELEASE Right 07/28/2022   Procedure: Right carpal tunnel release;  Surgeon: Kennedy Bucker, MD;  Location: [facility];  Service: Orthopedics;  Laterality: Right;   DILATION AND CURETTAGE OF UTERUS     GANGLION CYST EXCISION Right 07/28/2022   Procedure: Right wrist volar ganglion cyst excision;  Surgeon: Kennedy Bucker, MD;  Location: [facility];  Service: Orthopedics;  Laterality: Right;   TONSILLECTOMY     TUBAL LIGATION Bilateral 01/18/2019   Procedure: POST PARTUM TUBAL LIGATION, BILATERIAL;  Surgeon: Natale Milch, MD;  Location: [facility] OR[facility];  Service: Gynecology;  Laterality: Bilateral;    Inpatient Medications: Scheduled Meds:  Continuous Infusions:  PRN Meds:   Allergies:    Allergies  Allergen Reactions   Penicillin V Anaphylaxis   Tramadol Anaphylaxis    Patient reports extremely high elevated blood pressure.   Codeine     REACTION: hyper   Hydrocodone Other (See Comments)   Penicillins Other (See Comments)   Sertraline Hcl     REACTION: felt in "[place]"    Social History:   Social History   Socioeconomic History  Marital status: Married    Spouse name: Not on file   Number of children: Not on file   Years of education: Not on file   Highest education level: Not on file  Occupational History   Not on file  Tobacco Use   Smoking status: Every Day    Current packs/day: 0.50    Average packs/day: 0.5 packs/day for 20.1 years (10.1 ttl pk-yrs)    Types: Cigarettes    Start date: 2005   Smokeless tobacco: Never  Vaping Use   Vaping status: Never Used  Substance and Sexual Activity   Alcohol  use: No   Drug use: No   Sexual activity: Yes    Birth control/protection: Surgical    Comment: BTL  Other Topics Concern   Not on file  Social History Narrative   Not on file   Social Drivers of Health   Financial Resource Strain: Low Risk  (12/28/2022)   Received from [facility]   Overall Financial Resource Strain (CARDIA)    Difficulty of Paying Living Expenses: Not hard at all  Food Insecurity: No Food Insecurity (12/28/2022)   Received from [facility]   Hunger Vital Sign    Worried About Running Out of Food in the Last Year: Never true    Ran Out of Food in the Last Year: Never true  Transportation Needs: No Transportation Needs (12/28/2022)   Received from [facility]    In the past 12 months, has lack of transportation kept you from medical appointments or from getting medications?: No    Lack of Transportation (Non-Medical): No  Physical Activity: Not on file  Stress: Not on file  Social Connections: Not on file  Intimate Partner Violence: Not on file    Family History:   Family History  Problem Relation Age of Onset   Prostate cancer Maternal Grandfather 33   Lung cancer Maternal Grandfather 5   Anesthesia problems Neg Hx      ROS:  Please see the history of present illness. All other ROS reviewed and negative.     Physical Exam/Data:   Vitals:   02/17/23 1100 02/17/23 1245 02/17/23 1343 02/17/23 1345  BP: 106/79 105/73  104/73  Pulse: (!) 58 60  66  Resp: 14 13  18   Temp:   98.3 F (36.8 C)   TempSrc:   Oral   SpO2: 99% 97%  99%  Weight:      Height:        Intake/Output Summary (Last 24 hours) at 02/17/2023 1459 Last data filed at 02/17/2023 1059 Gross per 24 hour  Intake 1002.22 ml  Output --  Net 1002.22 ml      02/17/2023    9:07 AM 11/27/2022   10:53 AM 10/13/2022    1:56 PM  Last 3 Weights  Weight (lbs) 125 lb 117 lb 121 lb  Weight (kg) 56.7 kg 53.071  kg 54.885 kg     Body mass index is 22.14 kg/m.  General:  Well nourished, well developed, in no acute distress HEENT: normal Neck: no JVD Vascular: No carotid bruits; Distal pulses 2+ bilaterally Cardiac:  normal S1, S2; RRR; no murmur  Lungs:  clear to auscultation bilaterally, no wheezing, rhonchi or rales  Abd: soft, nontender, no hepatomegaly  Ext: no edema Musculoskeletal:  No deformities, BUE and BLE strength normal and equal Skin: warm and dry  Neuro:  CNs 2-12 intact, no focal  abnormalities noted Psych:  Normal affect   EKG:  The EKG was personally reviewed and demonstrates: Sinus rhythm heart rate 62.  RSR prime inferolateral leads.   Telemetry:  Telemetry was personally reviewed and demonstrates:    Relevant CV Studies:   Laboratory Data:  High Sensitivity Troponin:  No results for input(s): "TROPONINIHS" in the last 720 hours.   Chemistry Recent Labs  Lab 02/17/23 0928  NA 136  K 4.2  CL 105  CO2 23  GLUCOSE 106*  BUN 8  CREATININE 0.96  CALCIUM 9.1  MG 1.8  GFRNONAA >60  ANIONGAP 8    Recent Labs  Lab 02/17/23 0928  PROT 6.6  ALBUMIN 4.0  AST 27  ALT 22  ALKPHOS 55  BILITOT 1.1   Lipids No results for input(s): "CHOL", "TRIG", "HDL", "LABVLDL", "LDLCALC", "CHOLHDL" in the last 168 hours.  Hematology Recent Labs  Lab 02/17/23 0928  WBC 6.7  RBC 4.73  HGB 15.2*  HCT 43.7  MCV 92.4  MCH 32.1  MCHC 34.8  RDW 12.1  PLT 257   Thyroid  Recent Labs  Lab 02/17/23 0928  TSH 10.899*  FREET4 0.92    BNPNo results for input(s): "BNP", "PROBNP" in the last 168 hours.  DDimer No results for input(s): "DDIMER" in the last 168 hours.   Radiology/Studies:  No results found.   Assessment and Plan:   Syncope  Reporting episodes of syncope since the age of 28, progressive in the last 2 years, now worse in the past 3 to 4 days with multiple episodes of syncope (reports LOC).  Reports racing heart rates in the 200s.  Although her fitness  watch reports heart rates between 66-112. HR 70s here. Not  enough information to make diagnosis of POTS, symptoms seem somewhat questionable and her events seem to be more related to tachycardia. May have inappropriate tachycardic syndrome but would not suspect that to cause true episodes of syncope.  Check orthostatics.  Ordering echocardiogram. Continue to correct secondary causes such as encouraging high salt diet, adequate hydration, exercise.  Can consider compression stocks/abdominal binder.  No indications for midodrine at this time. Would benefit from cardiac monitor.  She needs to decide whether she is going to continue care with [place] or [place] clinic. MD to discuss final disposition/plan.  Hashimoto's thyroiditis TSH almost 11.  T4 normal.  Has plans to follow endocrinology.  Continue Synthroid per primary team.  Addendum: Negative orthostatics Orthostatics: Lying BP- Lying: 104/65Pulse- Lying: 58 Sitting BP- Sitting: 109/75Pulse- Sitting: 75 Standing BP: 104/87 Standing Pulse: 85    Risk Assessment/Risk Scores:        For questions or updates, please contact [facility] Please consult [email or web address] for contact info under    Signed, Abagail Kitchens, PA-C  02/17/2023 2:59 PM

## 2023-02-17 NOTE — Progress Notes (Signed)
  Echocardiogram 2D Echocardiogram has been performed.  Delcie Roch 02/17/2023, 5:08 PM

## 2023-02-17 NOTE — ED Notes (Signed)
Got patient into a gown on the monitor on the monitor did EKG shown to Dr Jeraldine Loots

## 2023-02-17 NOTE — ED Provider Notes (Signed)
[place] EMERGENCY DEPARTMENT AT [facility] Provider Note   CSN: [id number] Arrival date & time: 02/17/23  9147     History  Chief Complaint  Patient presents with   Loss of Consciousness    Sarah Rivas is a 38 y.o. female.  HPI Adult female arrives from home after an episode of syncope.  History obtained by EMS, at bedside on arrival.  Patient is a history of POTS, as well as hypothyroidism.  Over the past 12 hours patient has had 3 episodes of syncope or near syncope including 1 just prior to EMS notification.  Patient was scheduled to see cardiology today, but given frequency of syncopal episodes EMS was notified and she is brought here for evaluation.  Today was to be her first cardiology visit given episodic tachycardia, syncope, concern for POTS. Currently she describes feeling tired, no pain.  EMS reports the patient was tachycardic en route, but not with obvious arrhythmia.    Home Medications Prior to Admission medications   Medication Sig Start Date End Date Taking? Authorizing Provider  EPINEPHrine 0.3 mg/0.3 mL IJ SOAJ injection Inject 0.3 mg into the muscle as needed for anaphylaxis. 07/31/22  Yes Willy Eddy, MD  ibuprofen (ADVIL) 200 MG tablet Take 200-600 mg by mouth every 6 (six) hours as needed for moderate pain (pain score 4-6).   Yes [provider]  levothyroxine (SYNTHROID) 75 MCG tablet Take 1 tablet (75 mcg total) by mouth daily before breakfast. 11/28/22  Yes Dominica Severin, CNM  Vitamin D, Ergocalciferol, (DRISDOL) 1.25 MG (50000 UNIT) CAPS capsule Take 1 capsule (50,000 Units total) by mouth every 7 (seven) days. 10/25/22  Yes Dominica Severin, CNM  clindamycin (CLEOCIN) 300 MG capsule Take 300 mg by mouth 3 (three) times daily. Patient not taking: Reported on 02/17/2023 01/28/23   [provider]      Allergies    Penicillin v, Tramadol, Codeine, Hydrocodone, Penicillins, and Sertraline hcl    Review of  Systems   Review of Systems  Physical Exam Updated Vital Signs BP 106/79   Pulse (!) 58   Temp 97.7 F (36.5 C) (Oral)   Resp 14   Ht 5\' 3"  (1.6 m)   Wt 56.7 kg   LMP 02/10/2023   SpO2 99%   BMI 22.14 kg/m  Physical Exam Vitals and nursing note reviewed.  Constitutional:      General: She is not in acute distress.    Appearance: She is well-developed.  HENT:     Head: Normocephalic and atraumatic.  Eyes:     Conjunctiva/sclera: Conjunctivae normal.  Cardiovascular:     Rate and Rhythm: Normal rate and regular rhythm.  Pulmonary:     Effort: Pulmonary effort is normal. No respiratory distress.     Breath sounds: Normal breath sounds. No stridor.  Abdominal:     General: There is no distension.  Skin:    General: Skin is warm and dry.  Neurological:     Mental Status: She is alert and oriented to person, place, and time.     Cranial Nerves: No cranial nerve deficit.  Psychiatric:        Mood and Affect: Mood normal.     ED Results / Procedures / Treatments   Labs (all labs ordered are listed, but only abnormal results are displayed) Labs Reviewed  COMPREHENSIVE METABOLIC PANEL - Abnormal; Notable for the following components:      Result Value   Glucose, Bld 106 (*)  All other components within normal limits  CBC WITH DIFFERENTIAL/PLATELET - Abnormal; Notable for the following components:   Hemoglobin 15.2 (*)    All other components within normal limits  TSH - Abnormal; Notable for the following components:   TSH 10.899 (*)    All other components within normal limits  MAGNESIUM  T4, FREE    EKG EKG Interpretation Date/Time:  Wednesday February 17 2023 09:06:04 EST Ventricular Rate:  62 PR Interval:  185 QRS Duration:  110 QT Interval:  405 QTC Calculation: 412 R Axis:   79  Text Interpretation: Sinus rhythm RSR' in V1 or V2, right VCD or RVH Borderline T abnormalities, anterior leads Confirmed by Gerhard Munch [phone]) on 02/17/2023 9:14:04  AM  Radiology No results found.  Procedures Procedures    Medications Ordered in ED Medications  sodium chloride 0.9 % bolus 1,000 mL (0 mLs Intravenous Stopped 02/17/23 1059)    ED Course/ Medical Decision Making/ A&P                                 Medical Decision Making Adult female with endocrinologic disorder, POTS, now presents after frequent syncopal episodes in the past 12 hours.  No reported trauma, and physical exam is actually reassuring.  However, given the recurrent episodes, tachycardia, weakness, patient had labs monitoring ECG all from initial evaluation. Concern for electrolyte abnormalities, dehydration, worsening endocrinologic dysfunction. Cardiac 70 sinus normal Pulse ox 97% room air normal   Amount and/or Complexity of Data Reviewed Independent Historian: EMS External Data Reviewed: notes. Labs: ordered. Decision-making details documented in ED Course. Radiology: ordered and independent interpretation performed. Decision-making details documented in ED Course. ECG/medicine tests: ordered and independent interpretation performed. Decision-making details documented in ED Course.  Risk Prescription drug management. Decision regarding hospitalization. Diagnosis or treatment significantly limited by social determinants of health.   12:22 PM Results thus far discussed with the patient and her mother.  Vital signs remain essentially the same, mild hypotension, no tachycardia.  Patient awaiting cardiology consult.        Final Clinical Impression(s) / ED Diagnoses Final diagnoses:  Syncope and collapse     Gerhard Munch, MD 02/17/23 1520

## 2023-02-17 NOTE — Discharge Instructions (Addendum)
You were seen for passing out in the emergency department.   At home, please stay well hydrated.    Follow-up with your primary doctor in 2-3 days regarding your visit.  Follow-up with your cardiologist about your passing out. Please talk to them about a holter monitor.   Return immediately to the emergency department if you experience any of the following: Fainting, chest pain pain, difficulty breathing, unexplained vomiting or sweating, or any other concerning symptoms.    Thank you for visiting our Emergency Department. It was a pleasure taking care of you today.

## 2023-07-06 ENCOUNTER — Other Ambulatory Visit: Payer: Self-pay | Admitting: Certified Nurse Midwife

## 2023-07-09 ENCOUNTER — Encounter: Payer: Self-pay | Admitting: Emergency Medicine

## 2023-07-09 ENCOUNTER — Emergency Department
Admission: EM | Admit: 2023-07-09 | Discharge: 2023-07-09 | Disposition: A | Discharge status: Home / Self Care | Attending: Emergency Medicine | Admitting: Emergency Medicine

## 2023-07-09 ENCOUNTER — Other Ambulatory Visit: Payer: Self-pay

## 2023-07-09 DIAGNOSIS — T7840XA Allergy, unspecified, initial encounter: Secondary | ICD-10-CM | POA: Insufficient documentation

## 2023-07-09 MED ORDER — EPINEPHRINE 0.3 MG/0.3ML IJ SOAJ: 0.3000 mg | INTRAMUSCULAR | 1 refills | Status: AC | PRN

## 2023-07-09 MED ORDER — PREDNISONE 20 MG PO TABS: 40.0000 mg | ORAL_TABLET | Freq: Every day | ORAL | 0 refills | Status: AC

## 2023-07-09 MED ORDER — PREDNISONE 20 MG PO TABS
60.0000 mg | ORAL_TABLET | Freq: Once | ORAL | Status: AC
  Administered 2023-07-09: 60 mg via ORAL
  Filled 2023-07-09: qty 3

## 2023-07-09 MED ORDER — LORATADINE 10 MG PO TABS
10.0000 mg | ORAL_TABLET | Freq: Once | ORAL | Status: AC
  Administered 2023-07-09: 10 mg via ORAL
  Filled 2023-07-09: qty 1

## 2023-07-09 MED ORDER — SODIUM CHLORIDE 0.9 % IV BOLUS
500.0000 mL | Freq: Once | INTRAVENOUS | Status: AC
  Administered 2023-07-09: 500 mL via INTRAVENOUS

## 2023-07-09 MED ORDER — CLINDAMYCIN HCL 300 MG PO CAPS: 300.0000 mg | ORAL_CAPSULE | Freq: Three times a day (TID) | ORAL | 0 refills | Status: AC

## 2023-07-09 NOTE — Discharge Instructions (Addendum)
 Please avoid cephalosporin medications or keflex .

## 2023-07-09 NOTE — ED Provider Notes (Signed)
 [facility] Provider Note    Event Date/Time   First MD Initiated Contact with Patient 07/09/23 0710     (approximate)   History   Allergic Reaction   HPI  Sarah Rivas is a 38 y.o. female reports a history of prior allergies and sensitivities to many things.  Also has a history of anxiety and depression and hypothyroidism  During the middle the night last night patient noticed that she woke up itching and then earlier this morning she started to feel like she was wheezing and short of breath.  She called EMS.  She was administered epinephrine  Benadryl  and a DuoNeb.  Patient reports she feels much better and her rash and all of her symptoms are essentially gone with exception of just a few small areas of rash on her chest  She does not have any trouble breathing throat does not feel like it is closing she feels well at this time.  She reports she has had similar symptoms in the past reacting to many environmental allergies.  Of note though she also about 2 weeks ago removed a tick from her foot when she had a small infection between her toes on her right foot 4th and 5th.  She completed a course of doxycycline and is still continue to be sore so she is completing another 4 days of cephalexin .  Overall she reports her foot looks much better is no longer red is not swollen and the infection seems to be going away.  With further discussion she is able to identify the tick was actually a [place] tick.  And in further retrospect she reports that she ate spaghetti sauce with meat and it a few nights ago and had a similar but not as severe reaction and then last night a few hours before the itching started she ate a large cheeseburger.  She has an appointment with her primary doctor in about 1 week to review and obtain allergy testing any allergy panel per patient  Physical Exam   Triage Vital Signs: ED Triage Vitals  Encounter Vitals Group     BP 07/09/23  0653 (!) 95/51     Girls Systolic BP Percentile --      Girls Diastolic BP Percentile --      Boys Systolic BP Percentile --      Boys Diastolic BP Percentile --      Pulse Rate 07/09/23 0653 75     Resp 07/09/23 0653 18     Temp 07/09/23 0653 98 F (36.7 C)     Temp Source 07/09/23 0653 Oral     SpO2 07/09/23 0653 97 %     Weight 07/09/23 0652 128 lb (58.1 kg)     Height 07/09/23 0652 5' 3 (1.6 m)     Head Circumference --      Peak Flow --      Pain Score 07/09/23 0652 0     Pain Loc --      Pain Education --      Exclude from Growth Chart --     Most recent vital signs: Vitals:   07/09/23 0800 07/09/23 0900  BP: 92/67 (!) 91/57  Pulse: 78 94  Resp: 16 15  Temp:    SpO2: 97% 98%     General: Awake, no distress.  Widely patent oropharynx CV:  Good peripheral perfusion.  Normal tone Resp:  Normal effort.  Clear bilateral Abd:  No distention.  Soft nontender.  Very small area of urticaria on the abdomen patient reports much improved she reports it was covering her face arms back and upper body earlier Other:    She has not had any fevers or chills.  She denies any headaches not a flulike symptoms.  She has not noticed any rash other than when this rash popped up last night and is now regressed   ED Results / Procedures / Treatments   Labs (all labs ordered are listed, but only abnormal results are displayed) Labs Reviewed - No data to display   EKG  And by me at 650 heart rate 80 QRS 110 QTc 500 Slight artifact.  Normal sinus rhythm no evidence of acute ischemia   RADIOLOGY     PROCEDURES:  Critical Care performed: No  Procedures   MEDICATIONS ORDERED IN ED: Medications  predniSONE  (DELTASONE ) tablet 60 mg (60 mg Oral Given 07/09/23 0740)  loratadine  (CLARITIN ) tablet 10 mg (10 mg Oral Given 07/09/23 0740)  sodium chloride  0.9 % bolus 500 mL (500 mLs Intravenous New Bag/Given 07/09/23 0740)     IMPRESSION / MDM / ASSESSMENT AND PLAN / ED COURSE  I  reviewed the triage vital signs and the nursing notes.                              Differential diagnosis includes, but is not limited to, allergy, allergic reaction anaphylaxis etc.  She has symptoms that sounds as though anaphylactic earlier but she is now asymptomatic.  She has some mild hypotension but is asymptomatic and query if she may have some hypotension at baseline last time she was seen in the ER her blood pressure was 106/80.  She also relates a history of POTS and variation in her blood pressure frequently.   She has no further itching hives angioedema or wheezing.  She is fully awake and alert.  Discussed with the patient and its difficult to discern if she may have a cephalexin  allergy, though I am actually quite suspicious it could potentially be alpha gal syndrome in the setting of her similar reactions with red meats and a [place] tick bite not long ago.  Discussed with her and she will be following up with her doctor and obtain allergy testing as well as evaluation for potential allergy induced by a tick bite etc.  Will continue her on antihistamine, prednisone  and she already has EpiPen  that she keeps with her but will provide refill   Patient's presentation is most consistent with acute complicated illness / injury requiring diagnostic workup.   Changed to clindamycin  to complete her antibiotic course.  Patient to follow-up with her family doctor for further allergy testing including potential evaluation for alpha gal syndrome.  For now patient will avoid red meats, pork, dairy products.  Counseled patient on careful return precautions.  She has an epinephrine  pen already that she keeps with her that is unexpired  ----------------------------------------- 10:43 AM on 07/09/2023 ----------------------------------------- Patient resting comfortably in no distress feels back to normal no further symptoms.  Appropriate for discharge  Return precautions and treatment  recommendations and follow-up discussed with the patient who is agreeable with the plan.      FINAL CLINICAL IMPRESSION(S) / ED DIAGNOSES   Final diagnoses:  Allergic reaction, initial encounter     Rx / DC Orders   ED Discharge Orders          Ordered    EPINEPHrine  0.3  mg/0.3 mL IJ SOAJ injection  As needed        07/09/23 1041    predniSONE  (DELTASONE ) 20 MG tablet  Daily with breakfast        07/09/23 1041    clindamycin  (CLEOCIN ) 300 MG capsule  3 times daily        07/09/23 1041             Note:  This document was prepared using Dragon voice recognition software and may include unintentional dictation errors.   Dicky Anes, MD 07/09/23 1043

## 2023-07-09 NOTE — ED Triage Notes (Signed)
 Pt via [facility] from home. Pt states she was recently prescribed Keflex  for a skin infection from a tick bite, reports she took 1 dose at 1300 yesterday and another dose at 1900 yesterday. Reports she woke up this morning with throat swelling and hives. EMS states they gave her bolus, 1 EpiPen , 50mg  of Benadryl , and 1 Duoneb. States pt feel better now but just feels jittery. Denies pain. Pt is A&Ox4 and NAD

## 2023-08-14 ENCOUNTER — Emergency Department
Admission: EM | Admit: 2023-08-14 | Discharge: 2023-08-15 | Disposition: A | Discharge status: Home / Self Care | Attending: Emergency Medicine | Admitting: Emergency Medicine

## 2023-08-14 ENCOUNTER — Other Ambulatory Visit: Payer: Self-pay

## 2023-08-14 DIAGNOSIS — R519 Headache, unspecified: Secondary | ICD-10-CM | POA: Diagnosis present

## 2023-08-14 LAB — CBC
HCT: 41.1 % (ref 36.0–46.0)
Hemoglobin: 14 g/dL (ref 12.0–15.0)
MCH: 31.7 pg (ref 26.0–34.0)
MCHC: 34.1 g/dL (ref 30.0–36.0)
MCV: 93 fL (ref 80.0–100.0)
Platelets: 240 K/uL (ref 150–400)
RBC: 4.42 MIL/uL (ref 3.87–5.11)
RDW: 12 % (ref 11.5–15.5)
WBC: 5.1 K/uL (ref 4.0–10.5)
nRBC: 0 % (ref 0.0–0.2)

## 2023-08-14 LAB — BASIC METABOLIC PANEL WITH GFR
Anion gap: 6 (ref 5–15)
BUN: 15 mg/dL (ref 6–20)
CO2: 26 mmol/L (ref 22–32)
Calcium: 8.3 mg/dL — ABNORMAL LOW (ref 8.9–10.3)
Chloride: 106 mmol/L (ref 98–111)
Creatinine, Ser: 1.03 mg/dL — ABNORMAL HIGH (ref 0.44–1.00)
GFR, Estimated: >60 mL/min (ref >60)
Glucose, Bld: 100 mg/dL — ABNORMAL HIGH (ref 70–99)
Potassium: 3.7 mmol/L (ref 3.5–5.1)
Sodium: 138 mmol/L (ref 135–145)

## 2023-08-14 LAB — TROPONIN I (HIGH SENSITIVITY): Troponin I (High Sensitivity): <2 ng/L (ref <18)

## 2023-08-14 MED ORDER — PROCHLORPERAZINE EDISYLATE 10 MG/2ML IJ SOLN
10.0000 mg | Freq: Once | INTRAMUSCULAR | Status: AC
  Administered 2023-08-14: 10 mg via INTRAVENOUS
  Filled 2023-08-14: qty 2

## 2023-08-14 MED ORDER — ACETAMINOPHEN 500 MG PO TABS
1000.0000 mg | ORAL_TABLET | Freq: Once | ORAL | Status: AC
  Administered 2023-08-14: 1000 mg via ORAL
  Filled 2023-08-14: qty 2

## 2023-08-14 MED ORDER — LACTATED RINGERS IV BOLUS
1000.0000 mL | Freq: Once | INTRAVENOUS | Status: AC
  Administered 2023-08-14: 1000 mL via INTRAVENOUS

## 2023-08-14 MED ORDER — KETOROLAC TROMETHAMINE 30 MG/ML IJ SOLN
15.0000 mg | Freq: Once | INTRAMUSCULAR | Status: AC
  Administered 2023-08-14: 15 mg via INTRAVENOUS
  Filled 2023-08-14: qty 1

## 2023-08-14 MED ORDER — DIPHENHYDRAMINE HCL 50 MG/ML IJ SOLN
25.0000 mg | Freq: Once | INTRAMUSCULAR | Status: AC
  Administered 2023-08-14: 25 mg via INTRAVENOUS
  Filled 2023-08-14: qty 1

## 2023-08-14 NOTE — ED Triage Notes (Signed)
 Pt presents via POV c/o headache all day today. Reports has hx of POTS. Currently BP 100/73 HR 81

## 2023-08-14 NOTE — ED Provider Notes (Signed)
 [facility] Provider Note    Event Date/Time   First MD Initiated Contact with Patient 08/14/23 2259     (approximate)   History   Headache   HPI  Sarah Rivas is a 38 y.o. female who presents to the ED for evaluation of Headache   I review a cardiology clinic visit from 1 month ago.  History of POTS.  Patient presents for evaluation of a bad headache today.  She reports a history of frequent headaches and this feels like a typical headache of a severe intensity but normal quality.  She reports that she was recently diagnosed with alpha gal and that her PCP told her that she cannot take any OTC medications such as ibuprofen  or Tylenol  because they might be dirty and might have animal products in them.  So she has not yet taken any medications today.  No falls, syncope, fever, vision changes beyond photophobia.   Physical Exam   Triage Vital Signs: ED Triage Vitals  Encounter Vitals Group     BP 08/14/23 2039 100/73     Girls Systolic BP Percentile --      Girls Diastolic BP Percentile --      Boys Systolic BP Percentile --      Boys Diastolic BP Percentile --      Pulse Rate 08/14/23 2039 81     Resp 08/14/23 2039 16     Temp 08/14/23 2039 97.6 F (36.4 C)     Temp Source 08/14/23 2039 Oral     SpO2 08/14/23 2039 99 %     Weight --      Height --      Head Circumference --      Peak Flow --      Pain Score 08/14/23 2040 10     Pain Loc --      Pain Education --      Exclude from Growth Chart --     Most recent vital signs: Vitals:   08/14/23 2153 08/14/23 2230  BP: 93/66 (!) 105/52  Pulse: 71 (!) 56  Resp: 13 15  Temp:    SpO2: 98% 100%    General: Awake, no distress.  Appears mildly photophobic but generally well. CV:  Good peripheral perfusion.  Resp:  Normal effort.  Abd:  No distention.  MSK:  No deformity noted.  Neuro:  No focal deficits appreciated. Cranial nerves II through XII intact 5/5 strength and sensation in  all 4 extremities Other:     ED Results / Procedures / Treatments   Labs (all labs ordered are listed, but only abnormal results are displayed) Labs Reviewed  BASIC METABOLIC PANEL WITH GFR - Abnormal; Notable for the following components:      Result Value   Glucose, Bld 100 (*)    Creatinine, Ser 1.03 (*)    Calcium  8.3 (*)    All other components within normal limits  CBC  TROPONIN I (HIGH SENSITIVITY)    EKG Sinus rhythm with a rate of 75 bpm.  Normal axis and intervals without clear signs of acute ischemia.  RADIOLOGY   Official radiology report(s): No results found.  PROCEDURES and INTERVENTIONS:  Procedures  Medications  lactated ringers  bolus 1,000 mL (1,000 mLs Intravenous New Bag/Given 08/14/23 2307)  ketorolac  (TORADOL ) 30 MG/ML injection 15 mg (15 mg Intravenous Given 08/14/23 2308)  prochlorperazine  (COMPAZINE ) injection 10 mg (10 mg Intravenous Given 08/14/23 2310)  diphenhydrAMINE  (BENADRYL ) injection 25 mg (25 mg  Intravenous Given 08/14/23 2312)  acetaminophen  (TYLENOL ) tablet 1,000 mg (1,000 mg Oral Given 08/14/23 2308)     IMPRESSION / MDM / ASSESSMENT AND PLAN / ED COURSE  I reviewed the triage vital signs and the nursing notes.  Differential diagnosis includes, but is not limited to, migraine or tension headache, meningitis or encephalitis, ICH  {Patient presents with symptoms of an acute illness or injury that is potentially life-threatening.  Patient presents with atypical headache without red flag features.  Reassuring exam without neurologic deficits or meningeal features.  Reassuring CBC, troponin and metabolic panel without evidence of acute pathology.  Will provide nonnarcotic analgesia, migraine cocktail, and reassess.  Signed out to oncoming physician to follow-up on the patient with anticipation of outpatient management.      FINAL CLINICAL IMPRESSION(S) / ED DIAGNOSES   Final diagnoses:  None     Rx / DC Orders   ED Discharge Orders      None        Note:  This document was prepared using Dragon voice recognition software and may include unintentional dictation errors.   Claudene Rover, MD 08/14/23 2325

## 2023-08-14 NOTE — ED Triage Notes (Addendum)
 FIRST NURSE NOTE:  Pt arrived via [facility] from home reports HA all day, 1 hour PTA c/o lightheaded, weak, dizzy, no hx of vertigo, pt has hx of POTS, HR elevated at home max 100,   Per EMS pt was hyperventilating on arrival but pt denied anxiety  P72 EKG -NSR VSS CBG 108 108/60 98%   18G L AC NS with EMS  Pt is currently being treated for thrush.

## 2023-08-14 NOTE — ED Provider Notes (Signed)
-----------------------------------------   10:59 PM on 08/14/2023 -----------------------------------------  Assuming care from Dr. Claudene.  In short, Sarah Rivas is a 38 y.o. female with a chief complaint of headache.  Refer to the original H&P for additional details.  The current plan of care is to follow up after medications and reassess.   Clinical Course as of 08/15/23 9950  [place] Aug 15, 2023  0026 I reassessed the patient and she says she feels a whole lot better and is ready to go home.  The patient's medical screening exam is reassuring with no indication of an emergent medical condition requiring hospitalization or additional evaluation at this point.  The patient is safe and appropriate for discharge and outpatient follow up. [CF]    Clinical Course User Index [CF] Gordan Huxley, MD     Medications  lactated ringers  bolus 1,000 mL (0 mLs Intravenous Stopped 08/15/23 0044)  ketorolac  (TORADOL ) 30 MG/ML injection 15 mg (15 mg Intravenous Given 08/14/23 2308)  prochlorperazine  (COMPAZINE ) injection 10 mg (10 mg Intravenous Given 08/14/23 2310)  diphenhydrAMINE  (BENADRYL ) injection 25 mg (25 mg Intravenous Given 08/14/23 2312)  acetaminophen  (TYLENOL ) tablet 1,000 mg (1,000 mg Oral Given 08/14/23 2308)     ED Discharge Orders     None      Final diagnoses:  Bad headache     Gordan Huxley, MD 08/15/23 [phone]

## 2023-08-15 NOTE — ED Notes (Signed)
 Discharge instructions reviewed.   Opportunity for questions and concerns provided.   Alert, oriented and escorted to spouses vehicle via wheelchair. SABRA   Displays no signs of distress.

## 2023-08-15 NOTE — Discharge Instructions (Addendum)

## 2023-12-18 ENCOUNTER — Emergency Department: Admission: EM | Admit: 2023-12-18 | Discharge: 2023-12-19 | Disposition: A | Discharge status: Home / Self Care

## 2023-12-18 ENCOUNTER — Other Ambulatory Visit: Payer: Self-pay

## 2023-12-18 DIAGNOSIS — R519 Headache, unspecified: Secondary | ICD-10-CM

## 2023-12-18 DIAGNOSIS — M545 Low back pain, unspecified: Secondary | ICD-10-CM

## 2023-12-18 LAB — CBC
HCT: 39.9 % (ref 36.0–46.0)
Hemoglobin: 13.5 g/dL (ref 12.0–15.0)
MCH: 31.8 pg (ref 26.0–34.0)
MCHC: 33.8 g/dL (ref 30.0–36.0)
MCV: 93.9 fL (ref 80.0–100.0)
Platelets: 244 K/uL (ref 150–400)
RBC: 4.25 MIL/uL (ref 3.87–5.11)
RDW: 12.4 % (ref 11.5–15.5)
WBC: 10 K/uL (ref 4.0–10.5)
nRBC: 0 % (ref 0.0–0.2)

## 2023-12-18 LAB — BASIC METABOLIC PANEL WITH GFR
Anion gap: 10 (ref 5–15)
Anion gap: 9 (ref 5–15)
BUN: 7 mg/dL (ref 6–20)
BUN: 7 mg/dL (ref 6–20)
CO2: 26 mmol/L (ref 22–32)
CO2: 25 mmol/L (ref 22–32)
Calcium: 8.1 mg/dL — ABNORMAL LOW (ref 8.9–10.3)
Calcium: 8.4 mg/dL — ABNORMAL LOW (ref 8.9–10.3)
Chloride: 109 mmol/L (ref 98–111)
Chloride: 107 mmol/L (ref 98–111)
Creatinine, Ser: 0.75 mg/dL (ref 0.44–1.00)
Creatinine, Ser: 0.78 mg/dL (ref 0.44–1.00)
GFR, Estimated: >60 mL/min (ref >60)
GFR, Estimated: >60 mL/min (ref >60)
Glucose, Bld: 139 mg/dL — ABNORMAL HIGH (ref 70–99)
Glucose, Bld: 93 mg/dL (ref 70–99)
Potassium: 4 mmol/L (ref 3.5–5.1)
Potassium: 4.3 mmol/L (ref 3.5–5.1)
Sodium: 145 mmol/L (ref 135–145)
Sodium: 141 mmol/L (ref 135–145)

## 2023-12-18 LAB — CBC WITH DIFFERENTIAL/PLATELET
Abs Immature Granulocytes: 0.02 K/uL (ref 0.00–0.07)
Basophils Absolute: 0.1 K/uL (ref 0.0–0.1)
Basophils Relative: 1 %
Eosinophils Absolute: 0.2 K/uL (ref 0.0–0.5)
Eosinophils Relative: 4 %
HCT: 41.3 % (ref 36.0–46.0)
Hemoglobin: 14.1 g/dL (ref 12.0–15.0)
Immature Granulocytes: 0 %
Lymphocytes Relative: 36 %
Lymphs Abs: 1.9 K/uL (ref 0.7–4.0)
MCH: 32.2 pg (ref 26.0–34.0)
MCHC: 34.1 g/dL (ref 30.0–36.0)
MCV: 94.3 fL (ref 80.0–100.0)
Monocytes Absolute: 0.4 K/uL (ref 0.1–1.0)
Monocytes Relative: 7 %
Neutro Abs: 2.7 K/uL (ref 1.7–7.7)
Neutrophils Relative %: 52 %
Platelets: 248 K/uL (ref 150–400)
RBC: 4.38 MIL/uL (ref 3.87–5.11)
RDW: 12.2 % (ref 11.5–15.5)
WBC: 5.2 K/uL (ref 4.0–10.5)
nRBC: 0 % (ref 0.0–0.2)

## 2023-12-18 LAB — URINALYSIS, ROUTINE W REFLEX MICROSCOPIC
Bacteria, UA: NONE SEEN
Bilirubin Urine: NEGATIVE
Glucose, UA: NEGATIVE mg/dL
Ketones, ur: NEGATIVE mg/dL
Leukocytes,Ua: NEGATIVE
Nitrite: NEGATIVE
Protein, ur: NEGATIVE mg/dL
Specific Gravity, Urine: 1.014 (ref 1.005–1.030)
pH: 6 (ref 5.0–8.0)

## 2023-12-18 LAB — CBG MONITORING, ED: Glucose-Capillary: 89 mg/dL (ref 70–99)

## 2023-12-18 LAB — POC URINE PREG, ED: Preg Test, Ur: NEGATIVE

## 2023-12-18 MED ORDER — KETOROLAC TROMETHAMINE 30 MG/ML IJ SOLN
30.0000 mg | Freq: Once | INTRAMUSCULAR | Status: AC
  Administered 2023-12-18: 30 mg via INTRAVENOUS
  Filled 2023-12-18: qty 1

## 2023-12-18 MED ORDER — SODIUM CHLORIDE 0.9 % IV BOLUS
1000.0000 mL | Freq: Once | INTRAVENOUS | Status: AC
  Administered 2023-12-18: 1000 mL via INTRAVENOUS

## 2023-12-18 NOTE — ED Provider Notes (Signed)
° °  [facility] Provider Note    Event Date/Time   First MD Initiated Contact with Patient 12/18/23 2151     (approximate)   History   Abnormal Lab and Back Pain   HPI  Sarah Rivas is a 38 y.o. female  ***       Physical Exam   Triage Vital Signs: ED Triage Vitals  Encounter Vitals Group     BP 12/18/23 1851 100/73     Girls Systolic BP Percentile --      Girls Diastolic BP Percentile --      Boys Systolic BP Percentile --      Boys Diastolic BP Percentile --      Pulse Rate 12/18/23 1851 71     Resp 12/18/23 1851 20     Temp 12/18/23 1851 97.9 F (36.6 C)     Temp Source 12/18/23 1851 Oral     SpO2 12/18/23 1851 99 %     Weight 12/18/23 1813 200 lb (90.7 kg)     Height 12/18/23 1813 5' 6 (1.676 m)     Head Circumference --      Peak Flow --      Pain Score 12/18/23 1852 9     Pain Loc --      Pain Education --      Exclude from Growth Chart --     Most recent vital signs: Vitals:   12/18/23 1851 12/18/23 2157  BP: 100/73 100/63  Pulse: 71 64  Resp: 20 18  Temp: 97.9 F (36.6 C) 97.8 F (36.6 C)  SpO2: 99% 99%     General: Awake, no distress. *** CV:  Good peripheral perfusion. *** Resp:  Normal effort. *** Abd:  No distention. *** Other:  ***   ED Results / Procedures / Treatments   Labs (all labs ordered are listed, but only abnormal results are displayed) Labs Reviewed  BASIC METABOLIC PANEL WITH GFR - Abnormal; Notable for the following components:      Result Value   Glucose, Bld 139 (*)    Calcium  8.1 (*)    All other components within normal limits  CBC WITH DIFFERENTIAL/PLATELET  URINALYSIS, ROUTINE W REFLEX MICROSCOPIC  CBC  BASIC METABOLIC PANEL WITH GFR  POC URINE PREG, ED  CBG MONITORING, ED     EKG  ***   RADIOLOGY ***    PROCEDURES:  Critical Care performed: {CriticalCareYesNo:19197::Yes, see critical care procedure note(s),No}  Procedures   MEDICATIONS ORDERED IN  ED: Medications  sodium chloride  0.9 % bolus 1,000 mL (has no administration in time range)     IMPRESSION / MDM / ASSESSMENT AND PLAN / ED COURSE  I reviewed the triage vital signs and the nursing notes.                              Differential diagnosis includes, but is not limited to, ***  Patient's presentation is most consistent with {EM COPA:27473}  ***      FINAL CLINICAL IMPRESSION(S) / ED DIAGNOSES   Final diagnoses:  None     Rx / DC Orders   ED Discharge Orders     None        Note:  This document was prepared using Dragon voice recognition software and may include unintentional dictation errors.

## 2023-12-18 NOTE — ED Triage Notes (Signed)
 First nurse note: pt to ED [facility] from home for bilateral flank pain,dark urine, n/v. Was seen at PCP earlier in week and told kidney numbers off 20g R AC. 500mL NS PTA

## 2023-12-18 NOTE — ED Notes (Signed)
 Urine results in chart on 1815, 1826 not accurate to time and patient.

## 2023-12-18 NOTE — ED Triage Notes (Signed)
°  The note originally documented on this encounter has been moved the the encounter in which it belongs.

## 2023-12-18 NOTE — ED Notes (Signed)
 FIRST UA AND POC RESULTS IN CHART ARE FROM WRONG PT. LAB IS AWARE.

## 2023-12-18 NOTE — ED Triage Notes (Addendum)
 Triage note was entered on wrong pt.
